# Patient Record
Sex: Female | Born: 1990 | Race: White | Hispanic: No | Marital: Married | Smoking: Never smoker
Health system: Southern US, Community
[De-identification: ages and names within clinical notes are randomized; demographics above are authoritative.]

## PROBLEM LIST (undated history)

## (undated) DIAGNOSIS — Z8482 Family history of sudden infant death syndrome: Secondary | ICD-10-CM

## (undated) DIAGNOSIS — Z8619 Personal history of other infectious and parasitic diseases: Secondary | ICD-10-CM

## (undated) HISTORY — PX: TONSILLECTOMY: SUR1361

## (undated) HISTORY — PX: WISDOM TOOTH EXTRACTION: SHX21

---
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
## 2010-04-30 LAB — CBC
HCT: 34.9 % — ABNORMAL LOW (ref 36.0–46.0)
Hemoglobin: 12 g/dL (ref 12.0–15.0)
Hemoglobin: 13.1 g/dL (ref 12.0–15.0)
MCH: 30.7 pg (ref 26.0–34.0)
MCHC: 34.3 g/dL (ref 30.0–36.0)
MCV: 89.4 fL (ref 78.0–100.0)
Platelets: 185 10*3/uL (ref 150–400)
Platelets: 199 10*3/uL (ref 150–400)
RBC: 3.9 MIL/uL (ref 3.87–5.11)
RBC: 4.27 MIL/uL (ref 3.87–5.11)
RDW: 14.2 % (ref 11.5–15.5)
RDW: 14.1 % (ref 11.5–15.5)
WBC: 17.1 10*3/uL — ABNORMAL HIGH (ref 4.0–10.5)

## 2010-04-30 LAB — DIFFERENTIAL
Basophils Relative: 0 % (ref 0–1)
Eosinophils Absolute: 0.3 10*3/uL (ref 0.0–0.7)
Lymphocytes Relative: 26 % (ref 12–46)
Monocytes Absolute: 0.7 10*3/uL (ref 0.1–1.0)
Neutrophils Relative %: 63 % (ref 43–77)

## 2010-04-30 LAB — WET PREP, GENITAL: Trich, Wet Prep: NONE SEEN

## 2010-10-29 IMAGING — US US OB DETAIL+14 WK
1 series · 14 of 28 positions shown · non-contrast
Comparison: none

OBSTETRICAL ULTRASOUND:
 This ultrasound exam was performed in the [HOSPITAL] Ultrasound Department.  The OB US report was generated in the AS system, and faxed to the ordering physician.  This report is also available in [HOSPITAL]?s AccessANYware and in [REDACTED] PACS.

[Series 1: us ob detail +14 wk · 0.27mm/px · 85 acquisitions, 14 frames shown]
[im 4/85]
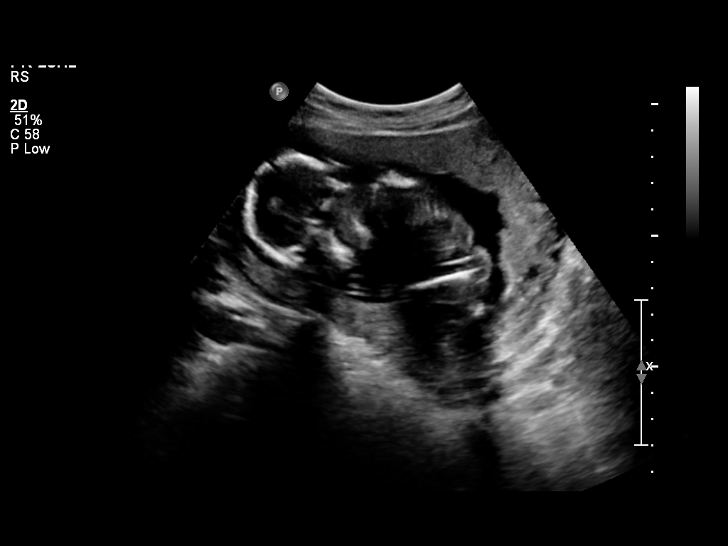
[im 10/85]
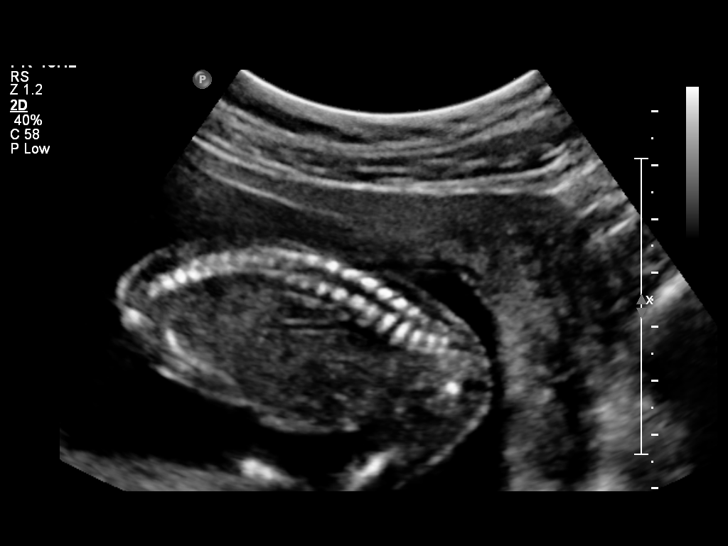
[im 16/85]
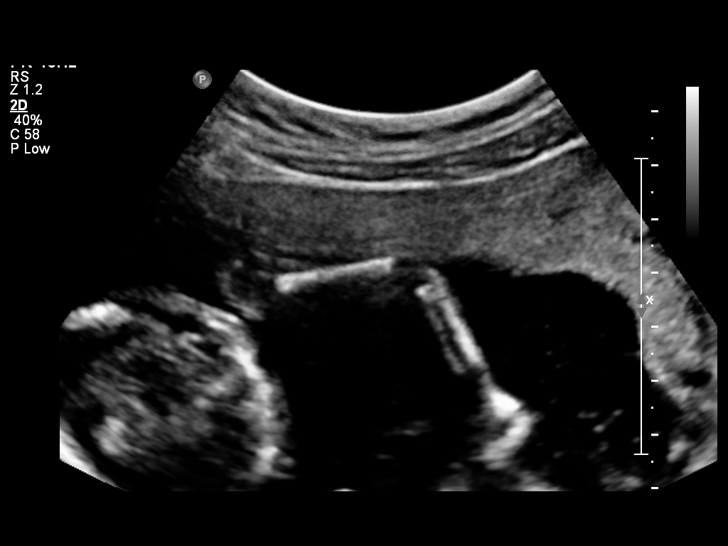
[im 22/85]
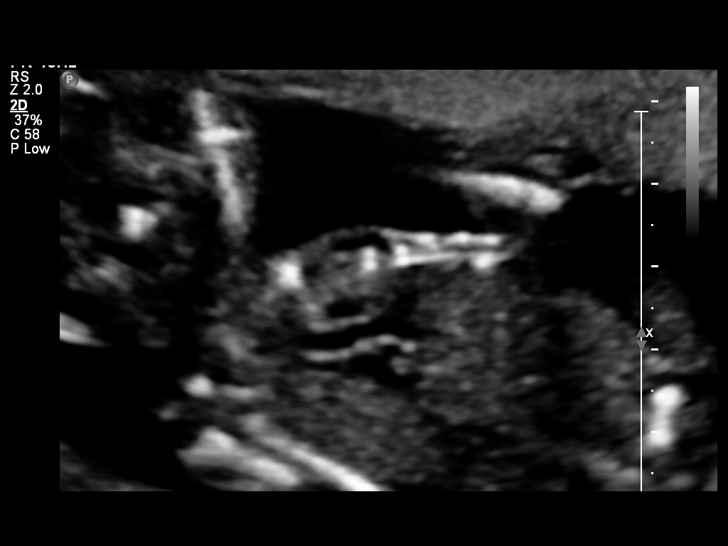
[im 29/85]
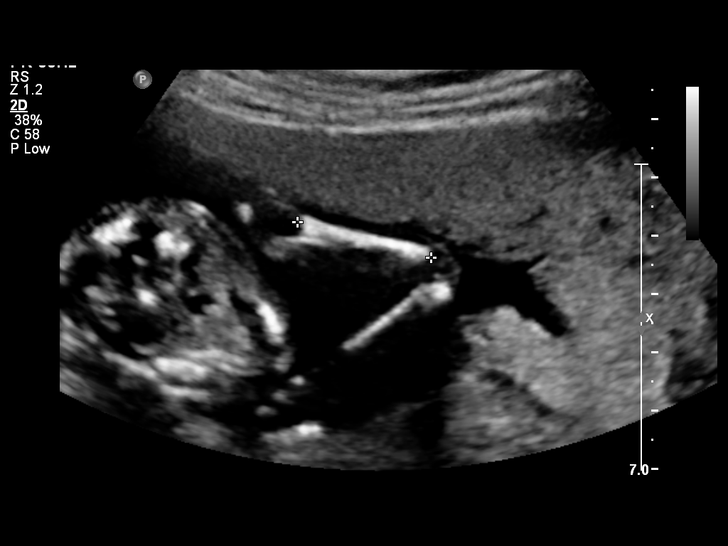
[im 35/85]
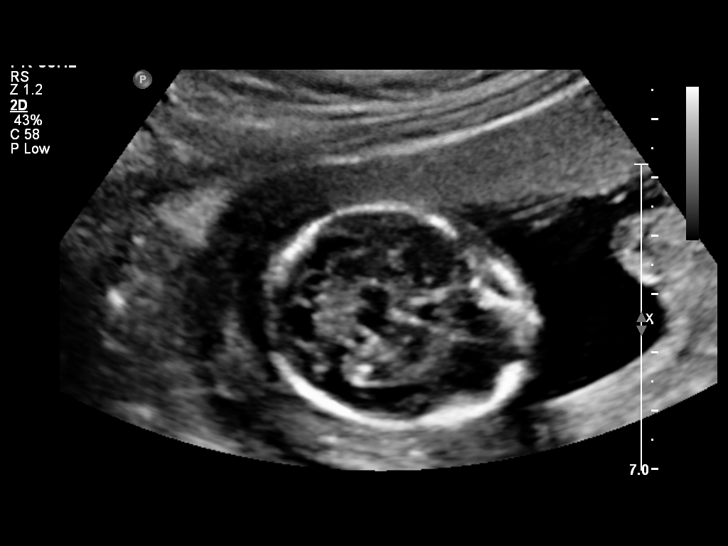
[im 41/85]
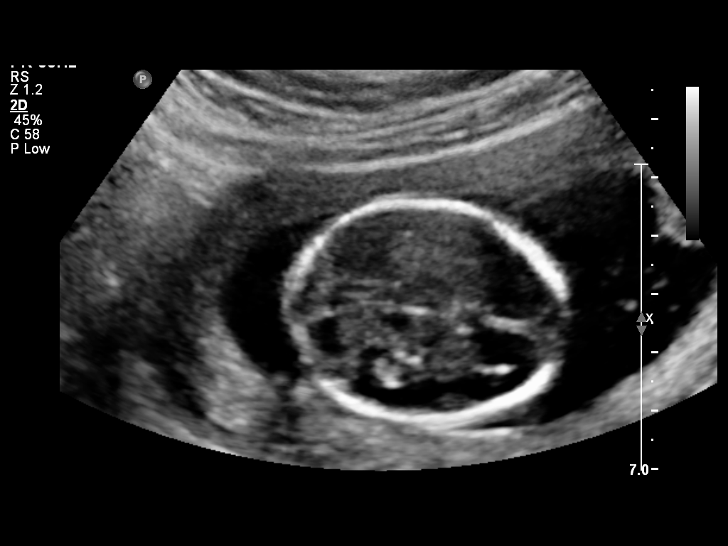
[im 47/85]
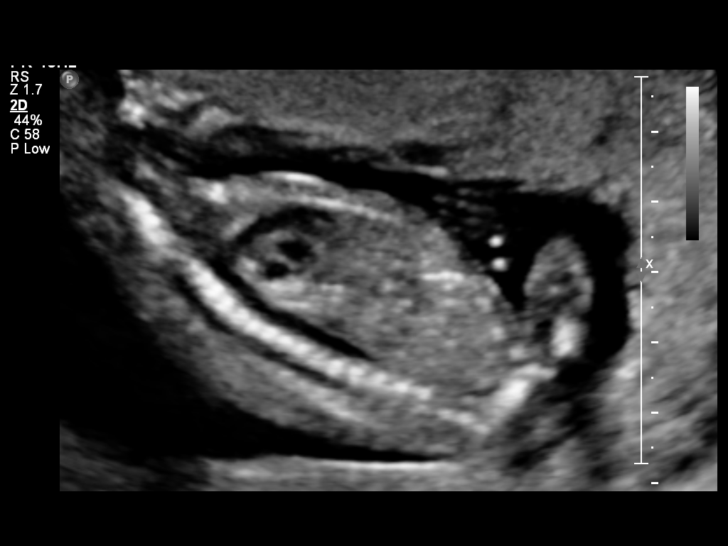
[im 53/85]
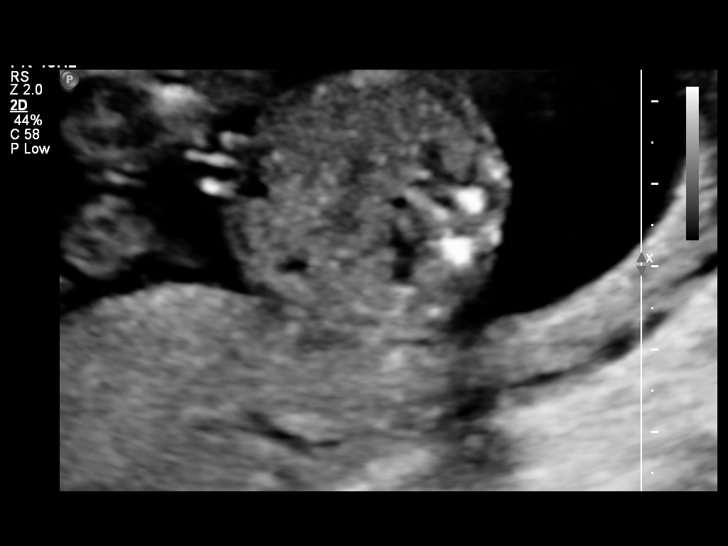
[im 60/85]
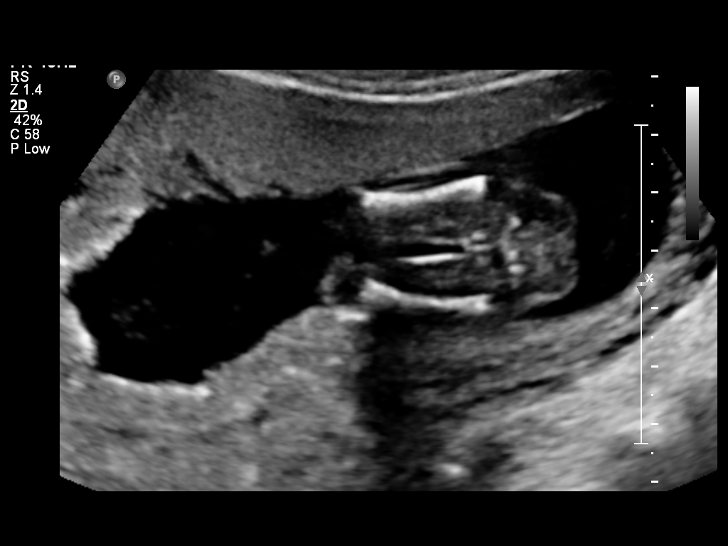
[im 66/85]
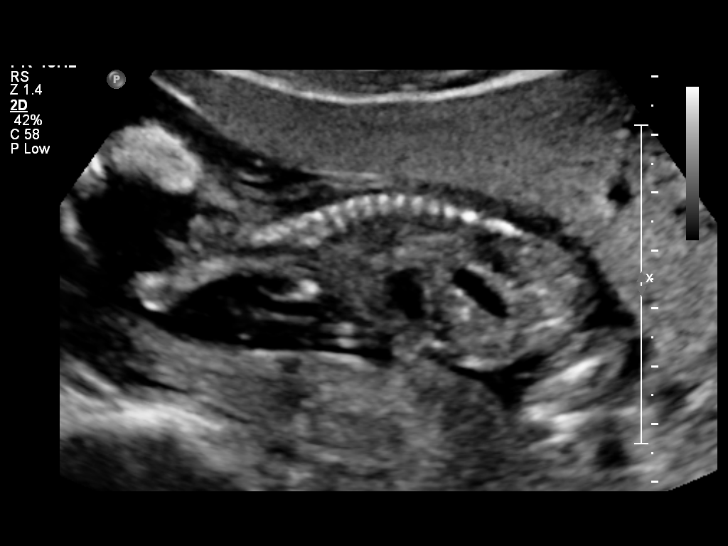
[im 72/85]
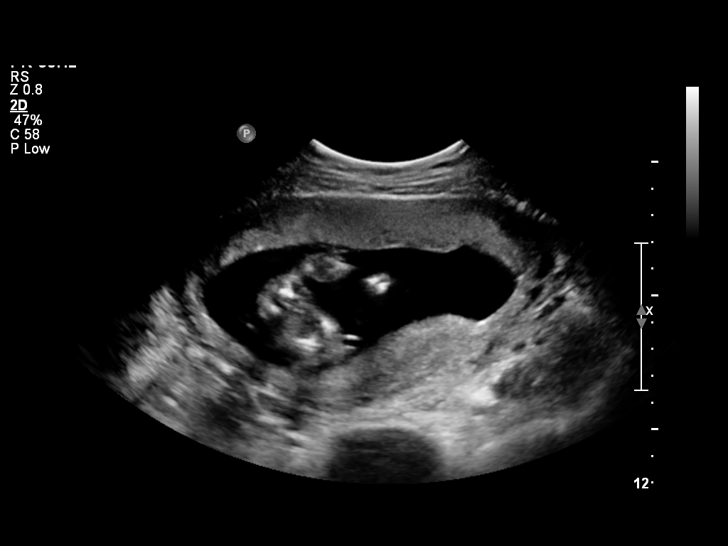
[im 78/85]
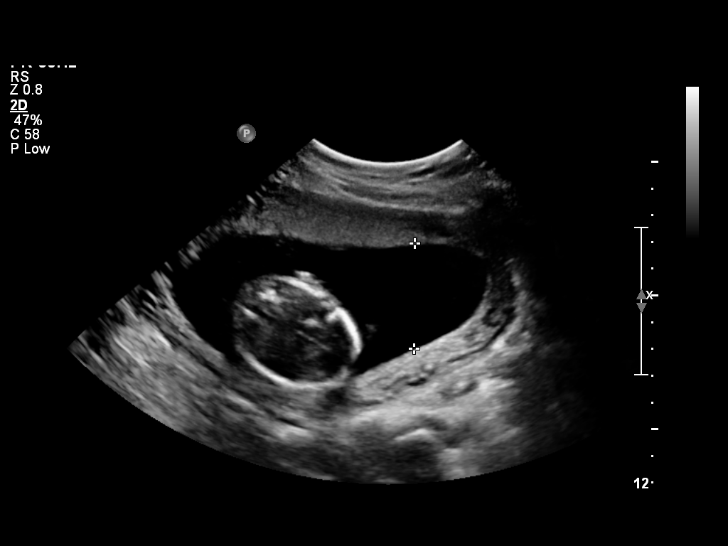
[im 85/85]
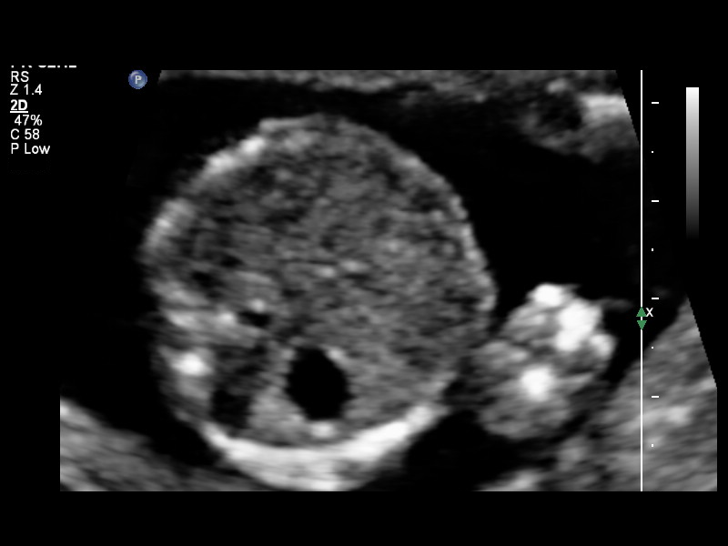

[14 of 28 positions shown; findings below may reference images not displayed]

IMPRESSION: See AS Obstetric US report.

## 2015-12-19 LAB — OB RESULTS CONSOLE GC/CHLAMYDIA
CHLAMYDIA, DNA PROBE: NEGATIVE
GC PROBE AMP, GENITAL: NEGATIVE

## 2015-12-19 LAB — OB RESULTS CONSOLE ABO/RH: RH Type: POSITIVE

## 2015-12-19 LAB — OB RESULTS CONSOLE RUBELLA ANTIBODY, IGM: RUBELLA: IMMUNE

## 2015-12-19 LAB — OB RESULTS CONSOLE HEPATITIS B SURFACE ANTIGEN: Hepatitis B Surface Ag: NEGATIVE

## 2015-12-19 LAB — OB RESULTS CONSOLE ANTIBODY SCREEN: ANTIBODY SCREEN: NEGATIVE

## 2015-12-19 LAB — OB RESULTS CONSOLE HIV ANTIBODY (ROUTINE TESTING): HIV: NONREACTIVE

## 2015-12-19 LAB — OB RESULTS CONSOLE RPR: RPR: NONREACTIVE

## 2016-05-04 DIAGNOSIS — N760 Acute vaginitis: Secondary | ICD-10-CM | POA: Diagnosis not present

## 2016-06-07 DIAGNOSIS — N760 Acute vaginitis: Secondary | ICD-10-CM | POA: Diagnosis not present

## 2016-07-19 ENCOUNTER — Encounter (HOSPITAL_COMMUNITY): Payer: Self-pay | Admitting: *Deleted

## 2016-07-19 ENCOUNTER — Telehealth (HOSPITAL_COMMUNITY): Payer: Self-pay | Admitting: *Deleted

## 2016-07-19 LAB — OB RESULTS CONSOLE GBS: GBS: NEGATIVE

## 2016-07-19 NOTE — Telephone Encounter (Signed)
Preadmission screen  

## 2016-07-25 ENCOUNTER — Inpatient Hospital Stay (HOSPITAL_COMMUNITY): Admission: AD | Admit: 2016-07-25 | Payer: 59 | Source: Ambulatory Visit | Admitting: Obstetrics and Gynecology

## 2016-07-28 ENCOUNTER — Encounter (HOSPITAL_COMMUNITY)
Admission: RE | Disposition: A | Discharge status: Home / Self Care | Payer: Self-pay | Source: Ambulatory Visit | Attending: Obstetrics & Gynecology

## 2016-07-28 ENCOUNTER — Inpatient Hospital Stay (HOSPITAL_COMMUNITY): Payer: 59 | Admitting: Anesthesiology

## 2016-07-28 ENCOUNTER — Inpatient Hospital Stay (HOSPITAL_COMMUNITY)
Admission: RE | Admit: 2016-07-28 | Discharge: 2016-07-31 | DRG: 765 | Disposition: A | Discharge status: Home / Self Care | Payer: 59 | Source: Ambulatory Visit | Attending: Obstetrics & Gynecology | Admitting: Obstetrics & Gynecology

## 2016-07-28 ENCOUNTER — Encounter (HOSPITAL_COMMUNITY): Payer: Self-pay

## 2016-07-28 ENCOUNTER — Telehealth (HOSPITAL_COMMUNITY): Payer: Self-pay | Admitting: Obstetrics and Gynecology

## 2016-07-28 DIAGNOSIS — A6004 Herpesviral vulvovaginitis: Secondary | ICD-10-CM | POA: Diagnosis present

## 2016-07-28 DIAGNOSIS — Z98891 History of uterine scar from previous surgery: Secondary | ICD-10-CM

## 2016-07-28 DIAGNOSIS — O480 Post-term pregnancy: Principal | ICD-10-CM | POA: Diagnosis present

## 2016-07-28 DIAGNOSIS — Z3490 Encounter for supervision of normal pregnancy, unspecified, unspecified trimester: Secondary | ICD-10-CM

## 2016-07-28 DIAGNOSIS — Z3A40 40 weeks gestation of pregnancy: Secondary | ICD-10-CM | POA: Diagnosis not present

## 2016-07-28 DIAGNOSIS — O9832 Other infections with a predominantly sexual mode of transmission complicating childbirth: Secondary | ICD-10-CM | POA: Diagnosis present

## 2016-07-28 LAB — TYPE AND SCREEN
ABO/RH(D): O POS
Antibody Screen: NEGATIVE

## 2016-07-28 LAB — CBC
HEMATOCRIT: 37.9 % (ref 36.0–46.0)
HEMOGLOBIN: 12.8 g/dL (ref 12.0–15.0)
MCH: 30 pg (ref 26.0–34.0)
MCHC: 33.8 g/dL (ref 30.0–36.0)
MCV: 89 fL (ref 78.0–100.0)
Platelets: 206 10*3/uL (ref 150–400)
RBC: 4.26 MIL/uL (ref 3.87–5.11)
RDW: 13.8 % (ref 11.5–15.5)
WBC: 13 10*3/uL — AB (ref 4.0–10.5)

## 2016-07-28 LAB — RPR: RPR: NONREACTIVE

## 2016-07-28 LAB — ABO/RH: ABO/RH(D): O POS

## 2016-07-28 SURGERY — Surgical Case
Anesthesia: Spinal

## 2016-07-28 MED ORDER — OXYCODONE-ACETAMINOPHEN 5-325 MG PO TABS: 2.0000 | ORAL_TABLET | ORAL | Status: DC | PRN

## 2016-07-28 MED ORDER — LACTATED RINGERS IV SOLN
INTRAVENOUS | Status: DC
  Administered 2016-07-28 (×5): via INTRAVENOUS

## 2016-07-28 MED ORDER — MENTHOL 3 MG MT LOZG: 1.0000 | LOZENGE | OROMUCOSAL | Status: DC | PRN

## 2016-07-28 MED ORDER — OXYTOCIN 40 UNITS IN LACTATED RINGERS INFUSION - SIMPLE MED
1.0000 m[IU]/min | INTRAVENOUS | Status: DC
  Administered 2016-07-28: 2 m[IU]/min via INTRAVENOUS
  Filled 2016-07-28: qty 1000

## 2016-07-28 MED ORDER — ZOLPIDEM TARTRATE 5 MG PO TABS: 5.0000 mg | ORAL_TABLET | Freq: Every evening | ORAL | Status: DC | PRN

## 2016-07-28 MED ORDER — ONDANSETRON HCL 4 MG/2ML IJ SOLN
INTRAMUSCULAR | Status: DC | PRN
  Administered 2016-07-28: 4 mg via INTRAVENOUS

## 2016-07-28 MED ORDER — SCOPOLAMINE 1 MG/3DAYS TD PT72
MEDICATED_PATCH | TRANSDERMAL | Status: AC
  Filled 2016-07-28: qty 1

## 2016-07-28 MED ORDER — TERBUTALINE SULFATE 1 MG/ML IJ SOLN: 0.2500 mg | Freq: Once | INTRAMUSCULAR | Status: DC | PRN

## 2016-07-28 MED ORDER — FENTANYL CITRATE (PF) 100 MCG/2ML IJ SOLN
INTRAMUSCULAR | Status: DC | PRN
  Administered 2016-07-28: 10 ug via INTRATHECAL

## 2016-07-28 MED ORDER — PANTOPRAZOLE SODIUM 40 MG PO TBEC: 40.0000 mg | DELAYED_RELEASE_TABLET | Freq: Every day | ORAL | Status: DC

## 2016-07-28 MED ORDER — FENTANYL CITRATE (PF) 100 MCG/2ML IJ SOLN: 25.0000 ug | INTRAMUSCULAR | Status: DC | PRN

## 2016-07-28 MED ORDER — MORPHINE SULFATE (PF) 0.5 MG/ML IJ SOLN
INTRAMUSCULAR | Status: DC | PRN
  Administered 2016-07-28: .2 mg via INTRATHECAL

## 2016-07-28 MED ORDER — OXYCODONE-ACETAMINOPHEN 5-325 MG PO TABS
1.0000 | ORAL_TABLET | ORAL | Status: DC | PRN
  Filled 2016-07-28: qty 1

## 2016-07-28 MED ORDER — LACTATED RINGERS IV SOLN
INTRAVENOUS | Status: DC
  Administered 2016-07-29: 04:00:00 via INTRAVENOUS

## 2016-07-28 MED ORDER — OXYTOCIN 10 UNIT/ML IJ SOLN
INTRAVENOUS | Status: DC | PRN
  Administered 2016-07-28: 40 [IU] via INTRAVENOUS

## 2016-07-28 MED ORDER — MISOPROSTOL 25 MCG QUARTER TABLET
25.0000 ug | ORAL_TABLET | ORAL | Status: DC | PRN
  Administered 2016-07-28: 25 ug via VAGINAL
  Filled 2016-07-28: qty 1

## 2016-07-28 MED ORDER — ERYTHROMYCIN 5 MG/GM OP OINT
TOPICAL_OINTMENT | OPHTHALMIC | Status: AC
  Filled 2016-07-28: qty 1

## 2016-07-28 MED ORDER — CEFAZOLIN SODIUM-DEXTROSE 2-3 GM-% IV SOLR
INTRAVENOUS | Status: DC | PRN
  Administered 2016-07-28: 2 g via INTRAVENOUS

## 2016-07-28 MED ORDER — KETOROLAC TROMETHAMINE 30 MG/ML IJ SOLN
30.0000 mg | Freq: Four times a day (QID) | INTRAMUSCULAR | Status: AC | PRN
  Administered 2016-07-28: 30 mg via INTRAMUSCULAR

## 2016-07-28 MED ORDER — FENTANYL CITRATE (PF) 100 MCG/2ML IJ SOLN: 50.0000 ug | INTRAMUSCULAR | Status: DC | PRN

## 2016-07-28 MED ORDER — LACTATED RINGERS IV SOLN
500.0000 mL | INTRAVENOUS | Status: DC | PRN
  Administered 2016-07-28 (×2): 500 mL via INTRAVENOUS

## 2016-07-28 MED ORDER — MORPHINE SULFATE (PF) 0.5 MG/ML IJ SOLN
INTRAMUSCULAR | Status: AC
  Filled 2016-07-28: qty 10

## 2016-07-28 MED ORDER — DIBUCAINE 1 % RE OINT: 1.0000 "application " | TOPICAL_OINTMENT | RECTAL | Status: DC | PRN

## 2016-07-28 MED ORDER — WITCH HAZEL-GLYCERIN EX PADS: 1.0000 "application " | MEDICATED_PAD | CUTANEOUS | Status: DC | PRN

## 2016-07-28 MED ORDER — DEXAMETHASONE SODIUM PHOSPHATE 4 MG/ML IJ SOLN
INTRAMUSCULAR | Status: DC | PRN
  Administered 2016-07-28: 4 mg via INTRAVENOUS

## 2016-07-28 MED ORDER — FLEET ENEMA 7-19 GM/118ML RE ENEM: 1.0000 | ENEMA | RECTAL | Status: DC | PRN

## 2016-07-28 MED ORDER — DOCUSATE SODIUM 100 MG PO CAPS
100.0000 mg | ORAL_CAPSULE | Freq: Two times a day (BID) | ORAL | Status: DC
  Administered 2016-07-29 – 2016-07-31 (×3): 100 mg via ORAL
  Filled 2016-07-28 (×5): qty 1

## 2016-07-28 MED ORDER — OXYTOCIN 40 UNITS IN LACTATED RINGERS INFUSION - SIMPLE MED
2.5000 [IU]/h | INTRAVENOUS | Status: DC
Start: 2016-07-28 — End: 2016-07-28

## 2016-07-28 MED ORDER — SOD CITRATE-CITRIC ACID 500-334 MG/5ML PO SOLN
30.0000 mL | ORAL | Status: DC | PRN
  Administered 2016-07-28: 30 mL via ORAL
  Filled 2016-07-28 (×2): qty 15

## 2016-07-28 MED ORDER — OXYTOCIN 10 UNIT/ML IJ SOLN
INTRAMUSCULAR | Status: AC
  Filled 2016-07-28: qty 4

## 2016-07-28 MED ORDER — OXYTOCIN BOLUS FROM INFUSION: 500.0000 mL | Freq: Once | INTRAVENOUS | Status: DC

## 2016-07-28 MED ORDER — KETOROLAC TROMETHAMINE 30 MG/ML IJ SOLN
INTRAMUSCULAR | Status: AC
  Filled 2016-07-28: qty 1

## 2016-07-28 MED ORDER — SENNOSIDES-DOCUSATE SODIUM 8.6-50 MG PO TABS
2.0000 | ORAL_TABLET | ORAL | Status: DC
  Administered 2016-07-28 – 2016-07-30 (×3): 2 via ORAL
  Filled 2016-07-28 (×3): qty 2

## 2016-07-28 MED ORDER — TETANUS-DIPHTH-ACELL PERTUSSIS 5-2.5-18.5 LF-MCG/0.5 IM SUSP: 0.5000 mL | Freq: Once | INTRAMUSCULAR | Status: DC

## 2016-07-28 MED ORDER — DIPHENHYDRAMINE HCL 25 MG PO CAPS
25.0000 mg | ORAL_CAPSULE | Freq: Four times a day (QID) | ORAL | Status: DC | PRN
  Administered 2016-07-29: 25 mg via ORAL
  Filled 2016-07-28: qty 1

## 2016-07-28 MED ORDER — SIMETHICONE 80 MG PO CHEW: 80.0000 mg | CHEWABLE_TABLET | ORAL | Status: DC | PRN

## 2016-07-28 MED ORDER — PHENYLEPHRINE 8 MG IN D5W 100 ML (0.08MG/ML) PREMIX OPTIME
INJECTION | INTRAVENOUS | Status: DC | PRN
  Administered 2016-07-28: 60 ug/min via INTRAVENOUS

## 2016-07-28 MED ORDER — LIDOCAINE HCL (PF) 1 % IJ SOLN: 30.0000 mL | INTRAMUSCULAR | Status: DC | PRN

## 2016-07-28 MED ORDER — COCONUT OIL OIL
1.0000 "application " | TOPICAL_OIL | Status: DC | PRN
  Filled 2016-07-28: qty 120

## 2016-07-28 MED ORDER — BUPIVACAINE IN DEXTROSE 0.75-8.25 % IT SOLN
INTRATHECAL | Status: DC | PRN
  Administered 2016-07-28: 10.5 mg via INTRATHECAL

## 2016-07-28 MED ORDER — ONDANSETRON HCL 4 MG/2ML IJ SOLN: 4.0000 mg | Freq: Four times a day (QID) | INTRAMUSCULAR | Status: DC | PRN

## 2016-07-28 MED ORDER — OXYTOCIN 40 UNITS IN LACTATED RINGERS INFUSION - SIMPLE MED: 2.5000 [IU]/h | INTRAVENOUS | Status: AC

## 2016-07-28 MED ORDER — METOCLOPRAMIDE HCL 5 MG/ML IJ SOLN: 10.0000 mg | Freq: Once | INTRAMUSCULAR | Status: DC | PRN

## 2016-07-28 MED ORDER — LACTATED RINGERS IV SOLN: INTRAVENOUS | Status: DC

## 2016-07-28 MED ORDER — SODIUM CHLORIDE 0.9 % IR SOLN
Status: DC | PRN
  Administered 2016-07-28: 1000 mL

## 2016-07-28 MED ORDER — DEXAMETHASONE SODIUM PHOSPHATE 4 MG/ML IJ SOLN
INTRAMUSCULAR | Status: AC
  Filled 2016-07-28: qty 1

## 2016-07-28 MED ORDER — OXYCODONE-ACETAMINOPHEN 5-325 MG PO TABS: 1.0000 | ORAL_TABLET | ORAL | Status: DC | PRN

## 2016-07-28 MED ORDER — CEFAZOLIN SODIUM-DEXTROSE 2-4 GM/100ML-% IV SOLN
INTRAVENOUS | Status: AC
  Filled 2016-07-28: qty 100

## 2016-07-28 MED ORDER — ACETAMINOPHEN 325 MG PO TABS: 650.0000 mg | ORAL_TABLET | Freq: Four times a day (QID) | ORAL | Status: DC | PRN

## 2016-07-28 MED ORDER — ACETAMINOPHEN 325 MG PO TABS
650.0000 mg | ORAL_TABLET | ORAL | Status: DC | PRN
  Administered 2016-07-29 – 2016-07-31 (×6): 650 mg via ORAL
  Filled 2016-07-28 (×6): qty 2

## 2016-07-28 MED ORDER — TERBUTALINE SULFATE 1 MG/ML IJ SOLN
0.2500 mg | Freq: Once | INTRAMUSCULAR | Status: DC | PRN
  Filled 2016-07-28: qty 1

## 2016-07-28 MED ORDER — KETOROLAC TROMETHAMINE 30 MG/ML IJ SOLN: 30.0000 mg | Freq: Four times a day (QID) | INTRAMUSCULAR | Status: AC | PRN

## 2016-07-28 MED ORDER — COMPLETENATE 29-1 MG PO CHEW: 1.0000 | CHEWABLE_TABLET | Freq: Every day | ORAL | Status: DC

## 2016-07-28 MED ORDER — ACETAMINOPHEN 325 MG PO TABS: 650.0000 mg | ORAL_TABLET | ORAL | Status: DC | PRN

## 2016-07-28 MED ORDER — IBUPROFEN 800 MG PO TABS: 800.0000 mg | ORAL_TABLET | Freq: Four times a day (QID) | ORAL | Status: DC | PRN

## 2016-07-28 MED ORDER — SIMETHICONE 80 MG PO CHEW
80.0000 mg | CHEWABLE_TABLET | Freq: Three times a day (TID) | ORAL | Status: DC
  Administered 2016-07-29 – 2016-07-31 (×4): 80 mg via ORAL
  Filled 2016-07-28 (×5): qty 1

## 2016-07-28 MED ORDER — IBUPROFEN 600 MG PO TABS
600.0000 mg | ORAL_TABLET | Freq: Four times a day (QID) | ORAL | Status: DC
  Administered 2016-07-29 – 2016-07-31 (×10): 600 mg via ORAL
  Filled 2016-07-28 (×11): qty 1

## 2016-07-28 MED ORDER — ONDANSETRON HCL 4 MG/2ML IJ SOLN
INTRAMUSCULAR | Status: AC
  Filled 2016-07-28: qty 2

## 2016-07-28 MED ORDER — PRENATAL MULTIVITAMIN CH
1.0000 | ORAL_TABLET | Freq: Every day | ORAL | Status: DC
  Administered 2016-07-29 – 2016-07-31 (×3): 1 via ORAL
  Filled 2016-07-28 (×3): qty 1

## 2016-07-28 MED ORDER — MEPERIDINE HCL 25 MG/ML IJ SOLN: 6.2500 mg | INTRAMUSCULAR | Status: DC | PRN

## 2016-07-28 MED ORDER — PHENYLEPHRINE 8 MG IN D5W 100 ML (0.08MG/ML) PREMIX OPTIME
INJECTION | INTRAVENOUS | Status: AC
  Filled 2016-07-28: qty 100

## 2016-07-28 MED ORDER — SCOPOLAMINE 1 MG/3DAYS TD PT72
MEDICATED_PATCH | TRANSDERMAL | Status: DC | PRN
  Administered 2016-07-28: 1 via TRANSDERMAL

## 2016-07-28 MED ORDER — FENTANYL CITRATE (PF) 100 MCG/2ML IJ SOLN
INTRAMUSCULAR | Status: AC
  Filled 2016-07-28: qty 2

## 2016-07-28 MED ORDER — CLINDAMYCIN PHOSPHATE 900 MG/50ML IV SOLN: 900.0000 mg | Freq: Once | INTRAVENOUS | Status: DC

## 2016-07-28 MED ORDER — SIMETHICONE 80 MG PO CHEW
80.0000 mg | CHEWABLE_TABLET | ORAL | Status: DC
  Administered 2016-07-28 – 2016-07-30 (×3): 80 mg via ORAL
  Filled 2016-07-28 (×3): qty 1

## 2016-07-28 SURGICAL SUPPLY — 32 items
BENZOIN TINCTURE PRP APPL 2/3 (GAUZE/BANDAGES/DRESSINGS) ×3 IMPLANT
CHLORAPREP W/TINT 26ML (MISCELLANEOUS) ×3 IMPLANT
CLAMP CORD UMBIL (MISCELLANEOUS) IMPLANT
CLOSURE WOUND 1/2 X4 (GAUZE/BANDAGES/DRESSINGS) ×1
CLOTH BEACON ORANGE TIMEOUT ST (SAFETY) ×3 IMPLANT
DERMABOND ADVANCED (GAUZE/BANDAGES/DRESSINGS)
DERMABOND ADVANCED .7 DNX12 (GAUZE/BANDAGES/DRESSINGS) IMPLANT
DRSG OPSITE POSTOP 4X10 (GAUZE/BANDAGES/DRESSINGS) ×3 IMPLANT
ELECT REM PT RETURN 9FT ADLT (ELECTROSURGICAL) ×3
ELECTRODE REM PT RTRN 9FT ADLT (ELECTROSURGICAL) ×1 IMPLANT
EXTRACTOR VACUUM KIWI (MISCELLANEOUS) IMPLANT
GLOVE BIO SURGEON STRL SZ 6 (GLOVE) ×6 IMPLANT
GLOVE BIOGEL PI IND STRL 6 (GLOVE) ×2 IMPLANT
GLOVE BIOGEL PI IND STRL 7.0 (GLOVE) ×1 IMPLANT
GLOVE BIOGEL PI INDICATOR 6 (GLOVE) ×4
GLOVE BIOGEL PI INDICATOR 7.0 (GLOVE) ×2
GOWN STRL REUS W/TWL LRG LVL3 (GOWN DISPOSABLE) ×6 IMPLANT
KIT ABG SYR 3ML LUER SLIP (SYRINGE) ×3 IMPLANT
NEEDLE HYPO 25X5/8 SAFETYGLIDE (NEEDLE) ×3 IMPLANT
NS IRRIG 1000ML POUR BTL (IV SOLUTION) ×3 IMPLANT
PACK C SECTION WH (CUSTOM PROCEDURE TRAY) ×3 IMPLANT
PAD OB MATERNITY 4.3X12.25 (PERSONAL CARE ITEMS) ×3 IMPLANT
PENCIL SMOKE EVAC W/HOLSTER (ELECTROSURGICAL) ×3 IMPLANT
STRIP CLOSURE SKIN 1/2X4 (GAUZE/BANDAGES/DRESSINGS) ×2 IMPLANT
SUT CHROMIC 0 CTX 36 (SUTURE) ×9 IMPLANT
SUT MON AB 2-0 CT1 27 (SUTURE) ×3 IMPLANT
SUT PDS AB 0 CT1 27 (SUTURE) IMPLANT
SUT PLAIN 0 NONE (SUTURE) IMPLANT
SUT VIC AB 0 CT1 36 (SUTURE) ×3 IMPLANT
SUT VIC AB 4-0 KS 27 (SUTURE) ×3 IMPLANT
TOWEL OR 17X24 6PK STRL BLUE (TOWEL DISPOSABLE) ×3 IMPLANT
TRAY FOLEY BAG SILVER LF 14FR (SET/KITS/TRAYS/PACK) IMPLANT

## 2016-07-28 NOTE — Op Note (Signed)
Annette Barry PROCEDURE DATE: 07/28/2016  PREOPERATIVE DIAGNOSIS: Intrauterine pregnancy at  [redacted]w[redacted]d weeks gestation, fetal intolerance of labor  POSTOPERATIVE DIAGNOSIS: The same  PROCEDURE:   Primary Low Transverse Cesarean Section  SURGEON:  Dr. Linda Hedges  INDICATIONS: Annette Barry is a 26 y.o. [phone] at [redacted]w[redacted]d scheduled for cesarean section secondary to fetal intolerance of labor.  The risks of cesarean section discussed with the patient included but were not limited to: bleeding which may require transfusion or reoperation; infection which may require antibiotics; injury to bowel, bladder, ureters or other surrounding organs; injury to the fetus; need for additional procedures including hysterectomy in the event of a life-threatening hemorrhage; placental abnormalities wth subsequent pregnancies, incisional problems, thromboembolic phenomenon and other postoperative/anesthesia complications. The patient concurred with the proposed plan, giving informed written consent for the procedure.    FINDINGS:  Viable female infant in cephalic presentation, APGARs 9,9: weight pending; clear amniotic fluid.  Intact placenta, three vessel cord.  Grossly normal uterus, ovaries and fallopian tubes. .   ANESTHESIA:  Spinal ESTIMATED BLOOD LOSS: 600 ml SPECIMENS: Placenta sent to pathology COMPLICATIONS: None immediate  PROCEDURE IN DETAIL:  The patient received intravenous antibiotics and had sequential compression devices applied to her lower extremities while in the preoperative area.  She was then taken to the operating room where spinal anesthesia was administered epidural anesthesia was dosed up to surgical level and was found to be adequate. She was then placed in a dorsal supine position with a leftward tilt, and prepped and draped in a sterile manner.  A foley catheter was placed into her bladder and attached to constant gravity.  After an adequate timeout was performed, a Pfannenstiel skin  incision was made with scalpel and carried through to the underlying layer of fascia. The fascia was incised in the midline and this incision was extended bilaterally using the Mayo scissors. Kocher clamps were applied to the superior aspect of the fascial incision and the underlying rectus muscles were dissected off bluntly. A similar process was carried out on the inferior aspect of the facial incision. The rectus muscles were separated in the midline bluntly and the peritoneum was entered bluntly.   Bladder flap was created sharply and developed bluntly.  Bladder blade was placed.  A transverse hysterotomy was made with a scalpel and extended bilaterally bluntly. The bladder blade was then removed. The infant was successfully delivered, and cord was clamped and cut and infant was handed over to awaiting neonatology team. Uterine massage was then administered and the placenta delivered intact with three-vessel cord. The uterus was cleared of clot and debris.  The hysterotomy was closed with 0 chromic.  A second imbricating suture of 0-chromic was used to reinforce the incision and aid in hemostasis.  The peritoneum and rectus muscles were noted to be hemostatic and were reapproximated using 2-0 monocryl in a running fashion.  The fascia was closed with 0-Vicryl in a running fashion with good restoration of anatomy.  The subcutaneus tissue was copiously irrigated.  The skin was closed with 4-0 vicryl in a subcuticular fashion.  Pt tolerated the procedure will.  All counts were correct x2.  Pt went to the recovery room in stable condition.

## 2016-07-28 NOTE — Progress Notes (Signed)
Patient had repetitive late decelerations despite multiple position changes and fluid resuscitation.  Pitocin was d/c'd and FHT immediately improved to 150 with moderate variability.  SVE 4/75/-1.  Patient counseled that I recommend primary C/S for fetal intolerance of labor.  She was informed of the risk of bleeding, infection, and scarring.  All questions were answered and the patient wishes to proceed.   Linda Hedges, DO

## 2016-07-28 NOTE — Progress Notes (Signed)
FHT 150-160s, + accels, occasional decel UCs q2-4 min Cx 3/80/-3

## 2016-07-28 NOTE — Progress Notes (Signed)
Dr. Lynnette Caffey at bedside, discussing with pt plan of care and recommendation of c-section for fetal heart intolerance. Discussing with pt risks and benefits of c-section. Pt verbalizes understanding and consents. FOB at bedside.

## 2016-07-28 NOTE — Anesthesia Procedure Notes (Signed)
Spinal  Patient location during procedure: OR Staffing Anesthesiologist: Taffy Delconte Performed: anesthesiologist  Preanesthetic Checklist Completed: patient identified, site marked, surgical consent, pre-op evaluation, timeout performed, IV checked, risks and benefits discussed and monitors and equipment checked Spinal Block Patient position: sitting Prep: DuraPrep Patient monitoring: heart rate, continuous pulse ox and blood pressure Approach: midline Location: L4-5 Injection technique: single-shot Needle Needle type: Sprotte  Needle gauge: 24 G Needle length: 9 cm Additional Notes Expiration date of kit checked and confirmed. Patient tolerated procedure well, without complications.       

## 2016-07-28 NOTE — Progress Notes (Signed)
Annette Barry is a 26 y.o. [phone] at [redacted]w[redacted]d by ultrasound admitted for induction of labor due to Post dates. Due date 07/25/16.  Subjective: Comfortable.  Feeling few mild CTX after one dose of VMP given around 130 am.    Objective: BP 120/70   Pulse 86   Temp 98.1 F (36.7 C) (Oral)   Resp 18   Ht 5\' 6"  (1.676 m)   Wt 178 lb (80.7 kg)   SpO2 100%   BMI 28.73 kg/m  No intake/output data recorded. No intake/output data recorded.  FHT:  FHR: 150 bpm, variability: moderate,  accelerations:  Present,  decelerations:  Absent UC:   irregular, every 5 minutes SVE:   Dilation: 3 Effacement (%): 80 Station: -3 Exam by:: Dr. Gaetano Net  Labs: Lab Results  Component Value Date   WBC 13.0 (H) 07/28/2016   HGB 12.8 07/28/2016   HCT 37.9 07/28/2016   MCV 89.0 07/28/2016   PLT 206 07/28/2016    Assessment / Plan: Induction of labor; will add pitocin  Labor: Progressing normally Preeclampsia:  n/a Fetal Wellbeing:  Category I Pain Control:  Labor support without medications I/D:  n/a Anticipated MOD:  NSVD  Annette Barry 07/28/2016, 7:45 AM

## 2016-07-28 NOTE — Anesthesia Preprocedure Evaluation (Signed)
Anesthesia Evaluation  Patient identified by MRN, date of birth, ID band Patient awake    Reviewed: Allergy & Precautions, NPO status , Patient's Chart, lab work & pertinent test results  Airway Mallampati: II  TM Distance: >3 FB Neck ROM: Full    Dental no notable dental hx.    Pulmonary neg pulmonary ROS,    Pulmonary exam normal breath sounds clear to auscultation       Cardiovascular negative cardio ROS Normal cardiovascular exam Rhythm:Regular Rate:Normal     Neuro/Psych negative neurological ROS  negative psych ROS   GI/Hepatic negative GI ROS, Neg liver ROS,   Endo/Other  negative endocrine ROS  Renal/GU negative Renal ROS  negative genitourinary   Musculoskeletal negative musculoskeletal ROS (+)   Abdominal   Peds negative pediatric ROS (+)  Hematology negative hematology ROS (+)   Anesthesia Other Findings   Reproductive/Obstetrics (+) Pregnancy                             Anesthesia Physical Anesthesia Plan  ASA: II and emergent  Anesthesia Plan: Spinal   Post-op Pain Management:    Induction:   PONV Risk Score and Plan: 1 and Ondansetron  Airway Management Planned: Natural Airway  Additional Equipment:   Intra-op Plan:   Post-operative Plan:   Informed Consent: I have reviewed the patients History and Physical, chart, labs and discussed the procedure including the risks, benefits and alternatives for the proposed anesthesia with the patient or authorized representative who has indicated his/her understanding and acceptance.   Dental advisory given  Plan Discussed with: CRNA  Anesthesia Plan Comments:         Anesthesia Quick Evaluation

## 2016-07-28 NOTE — Transfer of Care (Signed)
Immediate Anesthesia Transfer of Care Note  Patient: Annette Barry  Procedure(s) Performed: Procedure(s): CESAREAN SECTION (N/A)  Patient Location: PACU  Anesthesia Type:Spinal  Level of Consciousness: awake, alert  and oriented  Airway & Oxygen Therapy: Patient Spontanous Breathing  Post-op Assessment: Report given to RN and Post -op Vital signs reviewed and stable  Post vital signs: Reviewed and stable BP 102/56, SaO2 98%, HR 77, RR 12  Last Vitals:  Vitals:   07/28/16 1501 07/28/16 1601  BP: 113/66 (!) 110/55  Pulse: (!) 128 83  Resp:  18  Temp:  36.8 C    Last Pain:  Vitals:   07/28/16 1601  TempSrc: Oral  PainSc:          Complications: No apparent anesthesia complications

## 2016-07-28 NOTE — Anesthesia Pain Management Evaluation Note (Signed)
  CRNA Pain Management Visit Note  Patient: Annette Barry, 26 y.o., female  "Hello I am a member of the anesthesia team at [facility]. We have an anesthesia team available at all times to provide care throughout the hospital, including epidural management and anesthesia for C-section. I don't know your plan for the delivery whether it a natural birth, water birth, IV sedation, nitrous supplementation, doula or epidural, but we want to meet your pain goals."   1.Was your pain managed to your expectations on prior hospitalizations?   Yes   2.What is your expectation for pain management during this hospitalization?     Labor support without medications, Epidural, IV pain meds and Nitrous Oxide  3.How can we help you reach that goal? Pt open to discussion about all methods of pain management.  Record the patient's initial score and the patient's pain goal.   Pain: 2  Pain Goal: 10 [facility] wants you to be able to say your pain was always managed very well.  Eyan Hagood 07/28/2016

## 2016-07-28 NOTE — H&P (Signed)
Annette Barry is a 26 y.o. female presenting for post dates IIOL. Cytotec x 1 @ 1:30am. SROM with cervical check moments ago. 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 No lesions or symptoms now noted by patient. OB History    Gravida Para Term Preterm AB Living   4 2 2   1 2    SAB TAB Ectopic Multiple Live Births   1       2     History reviewed. No pertinent past medical history. Past Surgical History:  Procedure Laterality Date  . TONSILLECTOMY     Family History: family history includes Depression in her mother; Diabetes in her maternal grandmother; Heart disease in her father; Hypertension in her father. Social History:  reports that she has never smoked. She has never used smokeless tobacco. She reports that she does not drink alcohol or use drugs.     Maternal Diabetes: No Genetic Screening: Normal Maternal Ultrasounds/Referrals: Normal Fetal Ultrasounds or other Referrals:  None Maternal Substance Abuse:  No Significant Maternal Medications:  None Significant Maternal Lab Results:  None Other Comments:  None  Review of Systems  Eyes: Negative for blurred vision.  Gastrointestinal: Negative for abdominal pain.  Neurological: Negative for headaches.   Maternal Medical History:  Fetal activity: Perceived fetal activity is normal.      Dilation: 2 Effacement (%): 60 Station: Ballotable Exam by:: Dr. Gaetano Net Temperature 98.5 F (36.9 C), temperature source Oral, height 5\' 6"  (1.676 m), weight 178 lb (80.7 kg), SpO2 100 %. Maternal Exam:  Abdomen: Fetal presentation: vertex     Physical Exam  Cardiovascular: Normal rate and regular rhythm.   Respiratory: Effort normal and breath sounds normal.  GI: Soft. There is no tenderness.  Neurological: She has normal reflexes.    Cervix -/60/vtx/-3 SROM with check, clear  FHT late decels noted after Cytotec. Patient given IV fluid bolus 500cc x 2, oxygen, position change including  knee/chest. Late decels resolved. Right now baseline 160s with some 5 BPM decels  UCs q2-3 min, mod/firm   Prenatal labs: ABO, Rh: --/--/O POS (06/13 0115) Antibody: NEG (06/13 0115) Rubella: Immune (11/03 0000) RPR: Nonreactive (11/03 0000)  HBsAg: Negative (11/03 0000)  HIV: Non-reactive (11/03 0000)  GBS: Negative (06/04 0000)   Assessment/Plan: 26 yo G4P2 @ 40 3/7 weeks SROM Watch FHT closely   Annette Barry,Annette Barry E 07/28/2016, 3:45 AM

## 2016-07-29 LAB — CBC
HEMATOCRIT: 32.8 % — AB (ref 36.0–46.0)
Hemoglobin: 11.2 g/dL — ABNORMAL LOW (ref 12.0–15.0)
MCH: 30.6 pg (ref 26.0–34.0)
MCHC: 34.1 g/dL (ref 30.0–36.0)
MCV: 89.6 fL (ref 78.0–100.0)
PLATELETS: 191 10*3/uL (ref 150–400)
RBC: 3.66 MIL/uL — ABNORMAL LOW (ref 3.87–5.11)
RDW: 13.9 % (ref 11.5–15.5)
WBC: 21.8 10*3/uL — ABNORMAL HIGH (ref 4.0–10.5)

## 2016-07-29 MED ORDER — NALOXONE HCL 0.4 MG/ML IJ SOLN: 0.4000 mg | INTRAMUSCULAR | Status: DC | PRN

## 2016-07-29 MED ORDER — NALBUPHINE HCL 10 MG/ML IJ SOLN: 5.0000 mg | Freq: Once | INTRAMUSCULAR | Status: DC | PRN

## 2016-07-29 MED ORDER — ONDANSETRON HCL 4 MG/2ML IJ SOLN: 4.0000 mg | Freq: Three times a day (TID) | INTRAMUSCULAR | Status: DC | PRN

## 2016-07-29 MED ORDER — SODIUM CHLORIDE 0.9% FLUSH: 3.0000 mL | INTRAVENOUS | Status: DC | PRN

## 2016-07-29 MED ORDER — DIPHENHYDRAMINE HCL 50 MG/ML IJ SOLN: 12.5000 mg | INTRAMUSCULAR | Status: DC | PRN

## 2016-07-29 MED ORDER — SCOPOLAMINE 1 MG/3DAYS TD PT72: 1.0000 | MEDICATED_PATCH | Freq: Once | TRANSDERMAL | Status: DC

## 2016-07-29 MED ORDER — NALBUPHINE HCL 10 MG/ML IJ SOLN: 5.0000 mg | INTRAMUSCULAR | Status: DC | PRN

## 2016-07-29 MED ORDER — DEXTROSE 5 % IV SOLN
1.0000 ug/kg/h | INTRAVENOUS | Status: DC | PRN
  Filled 2016-07-29: qty 2

## 2016-07-29 MED ORDER — DIPHENHYDRAMINE HCL 25 MG PO CAPS: 25.0000 mg | ORAL_CAPSULE | ORAL | Status: DC | PRN

## 2016-07-29 NOTE — Clinical Social Work Maternal (Signed)
  CLINICAL SOCIAL WORK MATERNAL/CHILD NOTE  Patient Details  Name: Annette Barry MRN: [id number] Date of Birth: 24-Jan-1991  Date:  07/29/2016  Clinical Social Worker Initiating Note:  Annette Barry, MSW, LCSW-A  Date/ Time Initiated:  07/29/16/1126     Child's Name:  Annette Barry   Legal Guardian:  Other (Comment) (Not established by court system; MOB and FOB Annette Barry) parent collectively)   Need for Interpreter:  None   Date of Referral:  07/28/16     Reason for Referral:  Other (Comment) (MOB hx of anxiety and depression)   Referral Source:  RN   Address:  [place] [address]  Phone number:  [phone]   Household Members:  Self, Spouse   Natural Supports (not living in the home):  Parent, Extended Family, Friends, Immediate Family   Professional Supports: None   Employment: Full-time   Type of Work: Unknown    Education:  Economist:  Multimedia programmer   Other Resources:  Other (Comment) (None reported)   Cultural/Religious Considerations Which May Impact Care:  None reported.  Strengths:  Ability to meet basic needs , Compliance with medical plan , Home prepared for child    Risk Factors/Current Problems:  Mental Health Concerns    Cognitive State:  Alert , Able to Concentrate , Insightful    Mood/Affect:  Comfortable , Calm , Interested    CSW Assessment: CSW met with MOB at bedside to complete assessment for consult regarding hx of anxiety/depression. Upon this writers arrival, MOB was accompanied by FOB at which time she noted it was ok to continue. This Probation officer explained role and reasoning for visit. MOB was warm and inviting. This Probation officer inquired about hx of anxiety and depression. MOB notes she has dealt with it for some time. She notes the initial cause being the death of her baby at 4 months to SIDS. CSW observed MOB and FOB affect change slighty when MOB disclosed this information.  This Probation officer gave condolences and informed MOB and FOB that we did not have to discuss it further. Both MOB and FOB noted it was ok and they have since healed a lot since that time. This Probation officer inquired if MOB wanted resources for outpatient follow-up. MOB noted she is not interested at this time. CSW offered "feelings after birth" form and activities and groups at [facility] hospital education center. MOB was very thankful for that and noted she would be interested in maybe attending one of the classes. CSW assessed MOB's current mental state. MOB notes she feels great overall other than feeling tired from the delivery. This Probation officer informed MOB feeling tired is normal and to be expected. MOB verbalized understanding.  This Probation officer educated MOB on PPD and safe-sleeping/SIDS. MOB verbalized understanding. This Probation officer inquired if MOB was interested in medication to proactively treat anxiety/depression. MOB noted not at this time. CSW thanked MOB and FOB for participating in the assessment. At this time, CSW has no barriers to d/c.   CSW Plan/Description:  Information/Referral to [employer] , [employer] , No Further Intervention Required/No Barriers to Discharge    [employer], MSW, [facility]  Office: [phone]

## 2016-07-29 NOTE — Anesthesia Postprocedure Evaluation (Signed)
Anesthesia Post Note  Patient: Annette Barry  Procedure(s) Performed: Procedure(s) (LRB): CESAREAN SECTION (N/A)     Patient location during evaluation: Mother Baby Anesthesia Type: Spinal Level of consciousness: awake and alert Vital Signs Assessment: post-procedure vital signs reviewed and stable Respiratory status: spontaneous breathing Cardiovascular status: blood pressure returned to baseline Postop Assessment: no headache, patient able to bend at knees, no backache, no signs of nausea or vomiting, adequate PO intake and spinal receding Anesthetic complications: no    Last Vitals:  Vitals:   07/28/16 2345 07/29/16 0430  BP: (!) 109/55   Pulse: 77   Resp: 18 18  Temp: 37.3 C 36.7 C    Last Pain:  Vitals:   07/29/16 0511  TempSrc:   PainSc: 2    Pain Goal:                 Annette Barry

## 2016-07-29 NOTE — Progress Notes (Signed)
Subjective: Postpartum Day 1: Cesarean Delivery Patient reports tolerating PO.    Objective: Vital signs in last 24 hours: Temp:  [97.3 F (36.3 C)-99.1 F (37.3 C)] 98 F (36.7 C) (06/14 0430) Pulse Rate:  [64-133] 77 (06/13 2345) Resp:  [11-20] 18 (06/14 0430) BP: (102-128)/(52-76) 109/55 (06/13 2345) SpO2:  [95 %-100 %] 96 % (06/14 0430)  Physical Exam:  General: alert and cooperative Lochia: appropriate Uterine Fundus: firm, non- tender Incision: healing well DVT Evaluation: No evidence of DVT seen on physical exam. Negative Homan's sign. No cords or calf tenderness. No significant calf/ankle edema.   Recent Labs  07/28/16 0115 07/29/16 0513  HGB 12.8 11.2*  HCT 37.9 32.8*    Assessment/Plan: Status post Cesarean section. Doing well postoperatively.   Leukocytosis noted Plan to recheck CBC in am.  Annette Barry G 07/29/2016, 8:03 AM

## 2016-07-29 NOTE — Lactation Note (Signed)
This note was copied from a baby's chart. Lactation Consultation Note  Patient Name: Annette Barry [name] [id number] Date: 07/29/2016 Reason for consult: Initial assessment Mom c/o of nipple tenderness. Baby nursing frequently but Mom reports difficulty obtaining good depth. Basic teaching reviewed with Mom, positioning discussed. Encouraged to continue to BF with feeding ques, 8-12 times or more in 24 hours. Lactation brochure left for review. Mom to call with next feeding for [facility] assist. Encouraged to apply EBM to tendern nipples.   Maternal Data    Feeding    LATCH Score/Interventions                      Lactation Tools Discussed/Used WIC Program: No   Consult Status Consult Status: Follow-up Date: 07/29/16 Follow-up type: In-patient    Katrine Coho 07/29/2016, 4:15 PM

## 2016-07-30 LAB — CBC
HEMATOCRIT: 30.3 % — AB (ref 36.0–46.0)
Hemoglobin: 10.2 g/dL — ABNORMAL LOW (ref 12.0–15.0)
MCH: 30.4 pg (ref 26.0–34.0)
MCHC: 33.7 g/dL (ref 30.0–36.0)
MCV: 90.4 fL (ref 78.0–100.0)
Platelets: 176 10*3/uL (ref 150–400)
RBC: 3.35 MIL/uL — ABNORMAL LOW (ref 3.87–5.11)
RDW: 14.3 % (ref 11.5–15.5)
WBC: 12.5 10*3/uL — AB (ref 4.0–10.5)

## 2016-07-30 MED ORDER — OXYCODONE-ACETAMINOPHEN 5-325 MG PO TABS: 1.0000 | ORAL_TABLET | ORAL | 0 refills | Status: DC | PRN

## 2016-07-30 MED ORDER — IBUPROFEN 800 MG PO TABS: 800.0000 mg | ORAL_TABLET | Freq: Four times a day (QID) | ORAL | 0 refills | Status: DC | PRN

## 2016-07-30 NOTE — Discharge Summary (Signed)
Obstetric Discharge Summary Reason for Admission: induction of labor Prenatal Procedures: none Intrapartum Procedures: cesarean: low cervical, transverse Postpartum Procedures: none Complications-Operative and Postpartum: none Hemoglobin  Date Value Ref Range Status  07/30/2016 10.2 (L) 12.0 - 15.0 g/dL Final   HCT  Date Value Ref Range Status  07/30/2016 30.3 (L) 36.0 - 46.0 % Final    Physical Exam:  General: alert Lochia: appropriate Uterine Fundus: firm Incision: healing well DVT Evaluation: No evidence of DVT seen on physical exam.  Discharge Diagnoses: Term Pregnancy-delivered, fetal intolerance t labor, LTCS  Discharge Information: Date: 07/30/2016 Activity: pelvic rest Diet: routine Medications: PNV, Ibuprofen and Percocet Condition: stable Instructions: refer to practice specific booklet Discharge to: home Follow-up Information    [place] up in 10 day(s).   Contact information: [place] [address] [phone]           Newborn Data: Live born female  Birth Weight: 7 lb 4.1 oz (3290 g) APGAR: 9, 9  Home with mother.  Annette Barry 07/30/2016, 7:51 AM

## 2016-07-30 NOTE — Lactation Note (Signed)
This note was copied from a baby's chart. Lactation Consultation Note: Mother complaints of sore nipples. She has bilateral positional strips. Mother was given comfort gels. Advised mother to use cross cradle hold or football hold when latching. Advised to use off centered latch. Encouraged mother to do good support of infants neck and shoulders. Observed infant latched on the left breast. Infant suckling on and off with short burst of suckling and a few swallows. Mother taught to do breast compression. Mother was advised to do good breast massage and ice breast when milk is coming in. Mother was given a harmony hand pump and advised to use as needed. Mother encouraged to cue base feed and feed infant 8-12 times in 24 hours. Mother informed of rental pump if needed. She reports that she has ordered her pump but it has not come in yet. Mother thinks she will be ok using her hand pump. Mother is aware of available [facility] services .   Patient Name: Annette Barry [id number] Date: 07/30/2016     Maternal Data    Feeding Feeding Type: Breast Fed  LATCH Score/Interventions Latch: Grasps breast easily, tongue down, lips flanged, rhythmical sucking. Intervention(s): Adjust position;Breast compression;Assist with latch;Breast massage  Audible Swallowing: A few with stimulation Intervention(s): Hand expression;Alternate breast massage  Type of Nipple: Everted at rest and after stimulation  Comfort (Breast/Nipple): Filling, red/small blisters or bruises, mild/mod discomfort  Problem noted: Mild/Moderate discomfort Interventions (Mild/moderate discomfort): Comfort gels  Hold (Positioning): No assistance needed to correctly position infant at breast. Intervention(s): Breastfeeding basics reviewed;Support Pillows;Position options  LATCH Score: 8  Lactation Tools Discussed/Used     Consult Status      Darla Lesches 07/30/2016, 10:34 AM

## 2016-07-31 NOTE — Discharge Summary (Signed)
  Obstetric Discharge Summary Reason for Admission: induction of labor Prenatal Procedures: none Intrapartum Procedures: cesarean: low cervical, transverse Postpartum Procedures: none Complications-Operative and Postpartum: none Last Labs       Hemoglobin  Date Value Ref Range Status  07/30/2016 10.2 (L) 12.0 - 15.0 g/dL Final        HCT  Date Value Ref Range Status  07/30/2016 30.3 (L) 36.0 - 46.0 % Final      Physical Exam:  General: alert Lochia: appropriate Uterine Fundus: firm Incision: healing well DVT Evaluation: No evidence of DVT seen on physical exam.  Discharge Diagnoses: Term Pregnancy-delivered, fetal intolerance t labor, LTCS  Discharge Information: Date: 07/30/2016 Activity: pelvic rest Diet: routine Medications: PNV, Ibuprofen and Percocet Condition: stable Instructions: refer to practice specific booklet Discharge to: home    Follow-up Information    [place] Follow up in 10 day(s).   Contact information: [place] [address] [phone]           Newborn Data: Live born female  Birth Weight: 7 lb 4.1 oz (3290 g) APGAR: 9, 9  Home with mother.  Annette Barry 07/30/2016, 7:51 AM

## 2016-07-31 NOTE — Lactation Note (Signed)
This note was copied from a baby's chart. Lactation Consultation Note  Patient Name: Annette Barry [id number] Date: 07/31/2016 Reason for consult: Follow-up assessment;Breast/nipple pain  Mom states nipples are very sore.  Both nipples cracked at the base.  Mom would like to try a nipple shield.  Breasts are full this AM. Instructed on application of a 24 mm nipple shield.  After a few attempts baby latched well and mom much more comfortable.  Good swallows noted.  Discussed how to wean from shield once nipples healed.  Reviewed outpatient lactation services and support and encouraged to call prn.   Maternal Data    Feeding Feeding Type: Breast Fed Length of feed: 30 min  LATCH Score/Interventions Latch: Grasps breast easily, tongue down, lips flanged, rhythmical sucking. Intervention(s): Breast compression;Breast massage;Assist with latch;Adjust position  Audible Swallowing: Spontaneous and intermittent Intervention(s): Hand expression;Alternate breast massage  Type of Nipple: Everted at rest and after stimulation  Comfort (Breast/Nipple): Filling, red/small blisters or bruises, mild/mod discomfort  Problem noted: Cracked, bleeding, blisters, bruises;Mild/Moderate discomfort Interventions (Mild/moderate discomfort): Comfort gels  Hold (Positioning): Assistance needed to correctly position infant at breast and maintain latch. Intervention(s): Breastfeeding basics reviewed;Support Pillows  LATCH Score: 8  Lactation Tools Discussed/Used Tools: Nipple Shields Nipple shield size: 24   Consult Status Consult Status: Complete    Abir Craine S 07/31/2016, 9:05 AM

## 2017-05-12 DIAGNOSIS — D229 Melanocytic nevi, unspecified: Secondary | ICD-10-CM | POA: Diagnosis not present

## 2017-05-12 DIAGNOSIS — Z0000 Encounter for general adult medical examination without abnormal findings: Secondary | ICD-10-CM | POA: Diagnosis not present

## 2017-05-12 DIAGNOSIS — Z01419 Encounter for gynecological examination (general) (routine) without abnormal findings: Secondary | ICD-10-CM | POA: Diagnosis not present

## 2017-05-26 DIAGNOSIS — Z6827 Body mass index (BMI) 27.0-27.9, adult: Secondary | ICD-10-CM | POA: Diagnosis not present

## 2017-05-26 DIAGNOSIS — R609 Edema, unspecified: Secondary | ICD-10-CM | POA: Diagnosis not present

## 2017-05-26 DIAGNOSIS — I8393 Asymptomatic varicose veins of bilateral lower extremities: Secondary | ICD-10-CM | POA: Diagnosis not present

## 2017-05-27 DIAGNOSIS — D225 Melanocytic nevi of trunk: Secondary | ICD-10-CM | POA: Diagnosis not present

## 2017-05-27 DIAGNOSIS — D485 Neoplasm of uncertain behavior of skin: Secondary | ICD-10-CM | POA: Diagnosis not present

## 2017-05-27 DIAGNOSIS — L578 Other skin changes due to chronic exposure to nonionizing radiation: Secondary | ICD-10-CM | POA: Diagnosis not present

## 2017-11-15 DIAGNOSIS — Z01419 Encounter for gynecological examination (general) (routine) without abnormal findings: Secondary | ICD-10-CM | POA: Diagnosis not present

## 2017-11-15 DIAGNOSIS — Z6826 Body mass index (BMI) 26.0-26.9, adult: Secondary | ICD-10-CM | POA: Diagnosis not present

## 2017-11-22 DIAGNOSIS — Z1329 Encounter for screening for other suspected endocrine disorder: Secondary | ICD-10-CM | POA: Diagnosis not present

## 2017-11-22 DIAGNOSIS — Z1321 Encounter for screening for nutritional disorder: Secondary | ICD-10-CM | POA: Diagnosis not present

## 2017-11-22 DIAGNOSIS — Z131 Encounter for screening for diabetes mellitus: Secondary | ICD-10-CM | POA: Diagnosis not present

## 2017-11-22 DIAGNOSIS — Z13220 Encounter for screening for lipoid disorders: Secondary | ICD-10-CM | POA: Diagnosis not present

## 2018-01-03 DIAGNOSIS — N7689 Other specified inflammation of vagina and vulva: Secondary | ICD-10-CM | POA: Diagnosis not present

## 2018-01-03 DIAGNOSIS — N760 Acute vaginitis: Secondary | ICD-10-CM | POA: Diagnosis not present

## 2018-02-14 DIAGNOSIS — Z3169 Encounter for other general counseling and advice on procreation: Secondary | ICD-10-CM | POA: Diagnosis not present

## 2018-02-14 DIAGNOSIS — Z3200 Encounter for pregnancy test, result unknown: Secondary | ICD-10-CM | POA: Diagnosis not present

## 2018-03-14 DIAGNOSIS — N911 Secondary amenorrhea: Secondary | ICD-10-CM | POA: Diagnosis not present

## 2018-04-12 DIAGNOSIS — Z3481 Encounter for supervision of other normal pregnancy, first trimester: Secondary | ICD-10-CM | POA: Diagnosis not present

## 2018-04-12 DIAGNOSIS — Z3685 Encounter for antenatal screening for Streptococcus B: Secondary | ICD-10-CM | POA: Diagnosis not present

## 2018-04-12 LAB — OB RESULTS CONSOLE ABO/RH: RH Type: POSITIVE

## 2018-04-12 LAB — OB RESULTS CONSOLE HIV ANTIBODY (ROUTINE TESTING): HIV: NONREACTIVE

## 2018-04-12 LAB — OB RESULTS CONSOLE GC/CHLAMYDIA
Chlamydia: NEGATIVE
Gonorrhea: NEGATIVE

## 2018-04-12 LAB — OB RESULTS CONSOLE ANTIBODY SCREEN: Antibody Screen: NEGATIVE

## 2018-04-12 LAB — OB RESULTS CONSOLE RUBELLA ANTIBODY, IGM: Rubella: IMMUNE

## 2018-04-12 LAB — OB RESULTS CONSOLE RPR: RPR: NONREACTIVE

## 2018-04-12 LAB — OB RESULTS CONSOLE HEPATITIS B SURFACE ANTIGEN: Hepatitis B Surface Ag: NEGATIVE

## 2018-04-17 DIAGNOSIS — Z3400 Encounter for supervision of normal first pregnancy, unspecified trimester: Secondary | ICD-10-CM | POA: Diagnosis not present

## 2018-05-08 DIAGNOSIS — J029 Acute pharyngitis, unspecified: Secondary | ICD-10-CM | POA: Diagnosis not present

## 2018-05-08 DIAGNOSIS — Z7189 Other specified counseling: Secondary | ICD-10-CM | POA: Diagnosis not present

## 2018-05-08 DIAGNOSIS — J309 Allergic rhinitis, unspecified: Secondary | ICD-10-CM | POA: Diagnosis not present

## 2018-05-18 DIAGNOSIS — Z3A18 18 weeks gestation of pregnancy: Secondary | ICD-10-CM | POA: Diagnosis not present

## 2018-05-23 DIAGNOSIS — J0100 Acute maxillary sinusitis, unspecified: Secondary | ICD-10-CM | POA: Diagnosis not present

## 2018-09-22 LAB — OB RESULTS CONSOLE GBS: GBS: NEGATIVE

## 2018-10-04 ENCOUNTER — Encounter (HOSPITAL_COMMUNITY): Payer: Self-pay | Admitting: *Deleted

## 2018-10-04 NOTE — Patient Instructions (Signed)
Annette Barry  10/04/2018   Your procedure is scheduled on:  10/19/2018  Arrive at [place] at Entrance C on [address] at [facility]  and [employer]. You are invited to use the FREE valet parking or use the Visitor's parking deck.  Pick up the phone at the desk and dial [phone].  Call this number if you have problems the morning of surgery: [phone]  Remember:   Do not eat food:(After Midnight) Desps de medianoche.  Do not drink clear liquids: (After Midnight) Desps de medianoche.  Take these medicines the morning of surgery with A SIP OF WATER:  none   Do not wear jewelry, make-up or nail polish.  Do not wear lotions, powders, or perfumes. Do not wear deodorant.  Do not shave 48 hours prior to surgery.  Do not bring valuables to the hospital.  [facility] is not   responsible for any belongings or valuables brought to the hospital.  Contacts, dentures or bridgework may not be worn into surgery.  Leave suitcase in the car. After surgery it may be brought to your room.  For patients admitted to the hospital, checkout time is 11:00 AM the day of              discharge.      Please read over the following fact sheets that you were given:     Preparing for Surgery

## 2018-10-11 NOTE — H&P (Signed)
Annette Barry is a 28 y.o. female presenting for repeat C/S; previous x 1.  Patient has had uncomplicated antepartum course.  Patient has h/o first child SIDS at 28 mo.  GBS negative.  OB History    Gravida  5   Para  3   Term  3   Preterm      AB  1   Living  3     SAB  1   TAB      Ectopic      Multiple  0   Live Births  3          Past Medical History:  Diagnosis Date  . Family history of SIDS (sudden infant death syndrome)    first child died at 71 mos  . Hx of chlamydia infection    Past Surgical History:  Procedure Laterality Date  . CESAREAN SECTION N/A 07/28/2016   Procedure: CESAREAN SECTION;  Surgeon: Linda Hedges, DO;  Location: [place];  Service: Obstetrics;  Laterality: N/A;  . TONSILLECTOMY     Family History: family history includes Depression in her mother; Diabetes in her maternal grandmother; Heart disease in her father; Hypertension in her father. Social History:  reports that she has never smoked. She has never used smokeless tobacco. She reports that she does not drink alcohol or use drugs.     Maternal Diabetes: No Genetic Screening: Normal Maternal Ultrasounds/Referrals: Normal Fetal Ultrasounds or other Referrals:  None Maternal Substance Abuse:  No Significant Maternal Medications:  None Significant Maternal Lab Results:  Group B Strep negative Other Comments:  None  Review of Systems  HENT: Negative for ear pain.    Maternal Medical History:  Prenatal complications: no prenatal complications Prenatal Complications - Diabetes: none.      Height 5\' 5"  (1.651 m), weight 79.8 kg, last menstrual period 01/11/2018, unknown if currently breastfeeding. Maternal Exam:  Abdomen: Patient reports no abdominal tenderness. Surgical scars: low transverse.   Fundal height is c/w dates.   Estimated fetal weight is 7#8.       Physical Exam  Constitutional: She is oriented to person, place, and time. She appears  well-developed and well-nourished.  GI: Soft. There is no rebound and no guarding.  Neurological: She is alert and oriented to person, place, and time.  Skin: Skin is warm and dry.  Psychiatric: She has a normal mood and affect. Her behavior is normal.    Prenatal labs: ABO, Rh: O/Positive/-- (02/26 0000) Antibody: Negative (02/26 0000) Rubella: Immune (02/26 0000) RPR: Nonreactive (02/26 0000)  HBsAg: Negative (02/26 0000)  HIV: Non-reactive (02/26 0000)  GBS: Negative (08/07 0000)   Assessment/Plan: 28yo [id number] at 10 weeks for repeat C/S -Patient has been counseled re: risk of bleeding, infection, scarring, and damage to surrounding structures.  She understands this will commit her to repeat C/S in future and increased risk of uterine rupture and abnormal placentation.  All questions were answered.    Linda Hedges 10/11/2018, 7:18 AM

## 2018-10-17 ENCOUNTER — Other Ambulatory Visit: Payer: Self-pay

## 2018-10-17 ENCOUNTER — Other Ambulatory Visit (HOSPITAL_COMMUNITY)
Admission: RE | Admit: 2018-10-17 | Discharge: 2018-10-17 | Disposition: A | Discharge status: Home / Self Care | Payer: 59 | Source: Ambulatory Visit | Attending: Obstetrics & Gynecology | Admitting: Obstetrics & Gynecology

## 2018-10-17 LAB — CBC
HCT: 35.1 % — ABNORMAL LOW (ref 36.0–46.0)
Hemoglobin: 10.9 g/dL — ABNORMAL LOW (ref 12.0–15.0)
MCH: 27 pg (ref 26.0–34.0)
MCHC: 31.1 g/dL (ref 30.0–36.0)
MCV: 87.1 fL (ref 80.0–100.0)
Platelets: 191 10*3/uL (ref 150–400)
RBC: 4.03 MIL/uL (ref 3.87–5.11)
RDW: 13.4 % (ref 11.5–15.5)
WBC: 9.1 10*3/uL (ref 4.0–10.5)
nRBC: 0 % (ref 0.0–0.2)

## 2018-10-17 LAB — TYPE AND SCREEN
ABO/RH(D): O POS
Antibody Screen: NEGATIVE

## 2018-10-17 LAB — ABO/RH: ABO/RH(D): O POS

## 2018-10-17 LAB — SARS CORONAVIRUS 2 BY RT PCR (HOSPITAL ORDER, PERFORMED IN ~~LOC~~ HOSPITAL LAB): SARS Coronavirus 2: NEGATIVE

## 2018-10-17 NOTE — MAU Note (Signed)
Covid swab collected. Pt tolerated well. PT asymptomatic 

## 2018-10-18 LAB — RPR: RPR Ser Ql: NONREACTIVE

## 2018-10-18 MED ORDER — GENTAMICIN SULFATE 40 MG/ML IJ SOLN
5.0000 mg/kg | INTRAVENOUS | Status: AC
  Administered 2018-10-19: 07:00:00 330.4 mg via INTRAVENOUS
  Filled 2018-10-18: qty 8.25

## 2018-10-18 MED ORDER — CLINDAMYCIN PHOSPHATE 900 MG/50ML IV SOLN
900.0000 mg | INTRAVENOUS | Status: AC
  Administered 2018-10-19: 08:00:00 900 mg via INTRAVENOUS

## 2018-10-19 ENCOUNTER — Encounter (HOSPITAL_COMMUNITY): Payer: Self-pay | Admitting: *Deleted

## 2018-10-19 ENCOUNTER — Other Ambulatory Visit: Payer: Self-pay

## 2018-10-19 ENCOUNTER — Encounter (HOSPITAL_COMMUNITY)
Admission: RE | Disposition: A | Discharge status: Home / Self Care | Payer: Self-pay | Source: Home / Self Care | Attending: Obstetrics & Gynecology

## 2018-10-19 ENCOUNTER — Inpatient Hospital Stay (HOSPITAL_COMMUNITY): Payer: 59 | Admitting: Anesthesiology

## 2018-10-19 ENCOUNTER — Inpatient Hospital Stay (HOSPITAL_COMMUNITY)
Admission: RE | Admit: 2018-10-19 | Discharge: 2018-10-21 | DRG: 785 | Disposition: A | Discharge status: Home / Self Care | Payer: 59 | Attending: Obstetrics & Gynecology | Admitting: Obstetrics & Gynecology

## 2018-10-19 DIAGNOSIS — Z3A39 39 weeks gestation of pregnancy: Secondary | ICD-10-CM | POA: Diagnosis not present

## 2018-10-19 DIAGNOSIS — Z302 Encounter for sterilization: Secondary | ICD-10-CM

## 2018-10-19 DIAGNOSIS — O34211 Maternal care for low transverse scar from previous cesarean delivery: Principal | ICD-10-CM | POA: Diagnosis present

## 2018-10-19 DIAGNOSIS — Z98891 History of uterine scar from previous surgery: Secondary | ICD-10-CM

## 2018-10-19 HISTORY — DX: Personal history of other infectious and parasitic diseases: Z86.19

## 2018-10-19 HISTORY — DX: Family history of sudden infant death syndrome: Z84.82

## 2018-10-19 SURGERY — Surgical Case
Anesthesia: Spinal

## 2018-10-19 MED ORDER — COCONUT OIL OIL: 1.0000 "application " | TOPICAL_OIL | Status: DC | PRN

## 2018-10-19 MED ORDER — PHENYLEPHRINE HCL-NACL 20-0.9 MG/250ML-% IV SOLN
INTRAVENOUS | Status: DC | PRN
  Administered 2018-10-19: 60 ug/min via INTRAVENOUS

## 2018-10-19 MED ORDER — SIMETHICONE 80 MG PO CHEW: 80.0000 mg | CHEWABLE_TABLET | ORAL | Status: DC | PRN

## 2018-10-19 MED ORDER — ACETAMINOPHEN 325 MG PO TABS: 650.0000 mg | ORAL_TABLET | ORAL | Status: DC | PRN

## 2018-10-19 MED ORDER — OXYTOCIN 40 UNITS IN NORMAL SALINE INFUSION - SIMPLE MED
INTRAVENOUS | Status: DC | PRN
  Administered 2018-10-19: 200 mL via INTRAVENOUS

## 2018-10-19 MED ORDER — NALBUPHINE HCL 10 MG/ML IJ SOLN: 5.0000 mg | INTRAMUSCULAR | Status: DC | PRN

## 2018-10-19 MED ORDER — HYDROMORPHONE HCL 1 MG/ML IJ SOLN
1.0000 mg | Freq: Once | INTRAMUSCULAR | Status: AC
  Administered 2018-10-19: 1 mg via INTRAVENOUS
  Filled 2018-10-19: qty 1

## 2018-10-19 MED ORDER — DIPHENHYDRAMINE HCL 50 MG/ML IJ SOLN: 12.5000 mg | INTRAMUSCULAR | Status: DC | PRN

## 2018-10-19 MED ORDER — SCOPOLAMINE 1 MG/3DAYS TD PT72
MEDICATED_PATCH | TRANSDERMAL | Status: AC
  Filled 2018-10-19: qty 1

## 2018-10-19 MED ORDER — MORPHINE SULFATE (PF) 0.5 MG/ML IJ SOLN
INTRAMUSCULAR | Status: DC | PRN
  Administered 2018-10-19: 150 ug via INTRATHECAL

## 2018-10-19 MED ORDER — ONDANSETRON HCL 4 MG/2ML IJ SOLN: 4.0000 mg | Freq: Three times a day (TID) | INTRAMUSCULAR | Status: DC | PRN

## 2018-10-19 MED ORDER — SODIUM CHLORIDE 0.9% FLUSH: 3.0000 mL | INTRAVENOUS | Status: DC | PRN

## 2018-10-19 MED ORDER — LACTATED RINGERS IV SOLN
INTRAVENOUS | Status: DC
  Administered 2018-10-19: 07:00:00 via INTRAVENOUS

## 2018-10-19 MED ORDER — PHENYLEPHRINE HCL-NACL 20-0.9 MG/250ML-% IV SOLN
INTRAVENOUS | Status: AC
  Filled 2018-10-19: qty 500

## 2018-10-19 MED ORDER — FENTANYL CITRATE (PF) 100 MCG/2ML IJ SOLN
INTRAMUSCULAR | Status: DC | PRN
  Administered 2018-10-19: 15 ug via EPIDURAL

## 2018-10-19 MED ORDER — FENTANYL CITRATE (PF) 100 MCG/2ML IJ SOLN
INTRAMUSCULAR | Status: DC | PRN
  Administered 2018-10-19: 100 ug via INTRAVENOUS

## 2018-10-19 MED ORDER — NALOXONE HCL 0.4 MG/ML IJ SOLN: 0.4000 mg | INTRAMUSCULAR | Status: DC | PRN

## 2018-10-19 MED ORDER — DEXAMETHASONE SODIUM PHOSPHATE 4 MG/ML IJ SOLN
INTRAMUSCULAR | Status: DC | PRN
  Administered 2018-10-19: 4 mg via INTRAVENOUS

## 2018-10-19 MED ORDER — BUPIVACAINE HCL (PF) 0.75 % IJ SOLN
INTRAMUSCULAR | Status: DC | PRN
  Administered 2018-10-19: 1.6 mL

## 2018-10-19 MED ORDER — SODIUM CHLORIDE 0.9 % IR SOLN
Status: DC | PRN
  Administered 2018-10-19: 1000 mL

## 2018-10-19 MED ORDER — SOD CITRATE-CITRIC ACID 500-334 MG/5ML PO SOLN
ORAL | Status: AC
  Filled 2018-10-19: qty 30

## 2018-10-19 MED ORDER — DIPHENHYDRAMINE HCL 25 MG PO CAPS: 25.0000 mg | ORAL_CAPSULE | Freq: Four times a day (QID) | ORAL | Status: DC | PRN

## 2018-10-19 MED ORDER — SENNOSIDES-DOCUSATE SODIUM 8.6-50 MG PO TABS
2.0000 | ORAL_TABLET | ORAL | Status: DC
  Administered 2018-10-20 – 2018-10-21 (×2): 2 via ORAL
  Filled 2018-10-19 (×2): qty 2

## 2018-10-19 MED ORDER — STERILE WATER FOR IRRIGATION IR SOLN
Status: DC | PRN
  Administered 2018-10-19: 1000 mL

## 2018-10-19 MED ORDER — KETOROLAC TROMETHAMINE 30 MG/ML IJ SOLN
30.0000 mg | Freq: Once | INTRAMUSCULAR | Status: AC | PRN
  Administered 2018-10-19: 30 mg via INTRAVENOUS

## 2018-10-19 MED ORDER — OXYCODONE-ACETAMINOPHEN 5-325 MG PO TABS: 1.0000 | ORAL_TABLET | ORAL | Status: DC | PRN

## 2018-10-19 MED ORDER — MEPERIDINE HCL 25 MG/ML IJ SOLN: 6.2500 mg | INTRAMUSCULAR | Status: DC | PRN

## 2018-10-19 MED ORDER — DEXAMETHASONE SODIUM PHOSPHATE 4 MG/ML IJ SOLN
INTRAMUSCULAR | Status: AC
  Filled 2018-10-19: qty 1

## 2018-10-19 MED ORDER — PROMETHAZINE HCL 25 MG/ML IJ SOLN: 6.2500 mg | INTRAMUSCULAR | Status: DC | PRN

## 2018-10-19 MED ORDER — KETOROLAC TROMETHAMINE 30 MG/ML IJ SOLN
INTRAMUSCULAR | Status: AC
  Filled 2018-10-19: qty 1

## 2018-10-19 MED ORDER — NALBUPHINE HCL 10 MG/ML IJ SOLN: 5.0000 mg | Freq: Once | INTRAMUSCULAR | Status: DC | PRN

## 2018-10-19 MED ORDER — FENTANYL CITRATE (PF) 100 MCG/2ML IJ SOLN
INTRAMUSCULAR | Status: AC
  Filled 2018-10-19: qty 2

## 2018-10-19 MED ORDER — LACTATED RINGERS IV SOLN
INTRAVENOUS | Status: DC
  Administered 2018-10-19 (×2): via INTRAVENOUS

## 2018-10-19 MED ORDER — OXYTOCIN 40 UNITS IN NORMAL SALINE INFUSION - SIMPLE MED: 2.5000 [IU]/h | INTRAVENOUS | Status: AC

## 2018-10-19 MED ORDER — MENTHOL 3 MG MT LOZG: 1.0000 | LOZENGE | OROMUCOSAL | Status: DC | PRN

## 2018-10-19 MED ORDER — CLINDAMYCIN PHOSPHATE 900 MG/50ML IV SOLN
INTRAVENOUS | Status: AC
  Filled 2018-10-19: qty 50

## 2018-10-19 MED ORDER — HYDROMORPHONE HCL 1 MG/ML IJ SOLN: 0.2500 mg | INTRAMUSCULAR | Status: DC | PRN

## 2018-10-19 MED ORDER — OXYCODONE HCL 5 MG/5ML PO SOLN: 5.0000 mg | Freq: Once | ORAL | Status: DC | PRN

## 2018-10-19 MED ORDER — SIMETHICONE 80 MG PO CHEW
80.0000 mg | CHEWABLE_TABLET | Freq: Three times a day (TID) | ORAL | Status: DC
  Administered 2018-10-20 (×2): 80 mg via ORAL
  Filled 2018-10-19 (×3): qty 1

## 2018-10-19 MED ORDER — OXYTOCIN 40 UNITS IN NORMAL SALINE INFUSION - SIMPLE MED
INTRAVENOUS | Status: AC
  Filled 2018-10-19: qty 1000

## 2018-10-19 MED ORDER — OXYCODONE HCL 5 MG PO TABS: 5.0000 mg | ORAL_TABLET | Freq: Once | ORAL | Status: DC | PRN

## 2018-10-19 MED ORDER — PRENATAL MULTIVITAMIN CH
1.0000 | ORAL_TABLET | Freq: Every day | ORAL | Status: DC
  Administered 2018-10-20: 1 via ORAL
  Filled 2018-10-19: qty 1

## 2018-10-19 MED ORDER — MORPHINE SULFATE (PF) 0.5 MG/ML IJ SOLN
INTRAMUSCULAR | Status: AC
  Filled 2018-10-19: qty 10

## 2018-10-19 MED ORDER — CHLOROPROCAINE HCL (PF) 3 % IJ SOLN
INTRAMUSCULAR | Status: DC | PRN
  Administered 2018-10-19: 20 mL

## 2018-10-19 MED ORDER — ZOLPIDEM TARTRATE 5 MG PO TABS: 5.0000 mg | ORAL_TABLET | Freq: Every evening | ORAL | Status: DC | PRN

## 2018-10-19 MED ORDER — ONDANSETRON HCL 4 MG/2ML IJ SOLN
INTRAMUSCULAR | Status: AC
  Filled 2018-10-19: qty 2

## 2018-10-19 MED ORDER — DIBUCAINE (PERIANAL) 1 % EX OINT: 1.0000 "application " | TOPICAL_OINTMENT | CUTANEOUS | Status: DC | PRN

## 2018-10-19 MED ORDER — TETANUS-DIPHTH-ACELL PERTUSSIS 5-2.5-18.5 LF-MCG/0.5 IM SUSP: 0.5000 mL | Freq: Once | INTRAMUSCULAR | Status: DC

## 2018-10-19 MED ORDER — CHLOROPROCAINE HCL (PF) 3 % IJ SOLN
INTRAMUSCULAR | Status: AC
  Filled 2018-10-19: qty 20

## 2018-10-19 MED ORDER — WITCH HAZEL-GLYCERIN EX PADS: 1.0000 "application " | MEDICATED_PAD | CUTANEOUS | Status: DC | PRN

## 2018-10-19 MED ORDER — SCOPOLAMINE 1 MG/3DAYS TD PT72
1.0000 | MEDICATED_PATCH | Freq: Once | TRANSDERMAL | Status: DC
  Administered 2018-10-19: 1.5 mg via TRANSDERMAL

## 2018-10-19 MED ORDER — SOD CITRATE-CITRIC ACID 500-334 MG/5ML PO SOLN
30.0000 mL | Freq: Once | ORAL | Status: AC
  Administered 2018-10-19: 30 mL via ORAL

## 2018-10-19 MED ORDER — SIMETHICONE 80 MG PO CHEW
80.0000 mg | CHEWABLE_TABLET | ORAL | Status: DC
  Administered 2018-10-20 – 2018-10-21 (×2): 80 mg via ORAL
  Filled 2018-10-19 (×2): qty 1

## 2018-10-19 MED ORDER — ONDANSETRON HCL 4 MG/2ML IJ SOLN
INTRAMUSCULAR | Status: DC | PRN
  Administered 2018-10-19: 4 mg via INTRAVENOUS

## 2018-10-19 MED ORDER — NALOXONE HCL 4 MG/10ML IJ SOLN
1.0000 ug/kg/h | INTRAVENOUS | Status: DC | PRN
  Filled 2018-10-19: qty 5

## 2018-10-19 MED ORDER — IBUPROFEN 800 MG PO TABS
800.0000 mg | ORAL_TABLET | Freq: Four times a day (QID) | ORAL | Status: DC | PRN
  Administered 2018-10-19 – 2018-10-21 (×6): 800 mg via ORAL
  Filled 2018-10-19 (×7): qty 1

## 2018-10-19 MED ORDER — DIPHENHYDRAMINE HCL 25 MG PO CAPS: 25.0000 mg | ORAL_CAPSULE | ORAL | Status: DC | PRN

## 2018-10-19 SURGICAL SUPPLY — 38 items
BENZOIN TINCTURE PRP APPL 2/3 (GAUZE/BANDAGES/DRESSINGS) ×4 IMPLANT
CHLORAPREP W/TINT 26ML (MISCELLANEOUS) ×4 IMPLANT
CLAMP CORD UMBIL (MISCELLANEOUS) IMPLANT
CLOSURE STERI STRIP 1/2 X4 (GAUZE/BANDAGES/DRESSINGS) ×4 IMPLANT
CLOSURE WOUND 1/2 X4 (GAUZE/BANDAGES/DRESSINGS)
CLOTH BEACON ORANGE TIMEOUT ST (SAFETY) ×4 IMPLANT
DERMABOND ADVANCED (GAUZE/BANDAGES/DRESSINGS)
DERMABOND ADVANCED .7 DNX12 (GAUZE/BANDAGES/DRESSINGS) IMPLANT
DRAPE C SECTION CLR SCREEN (DRAPES) ×4 IMPLANT
DRSG OPSITE POSTOP 4X10 (GAUZE/BANDAGES/DRESSINGS) ×4 IMPLANT
DRSG PAD ABDOMINAL 8X10 ST (GAUZE/BANDAGES/DRESSINGS) ×4 IMPLANT
ELECT REM PT RETURN 9FT ADLT (ELECTROSURGICAL) ×4
ELECTRODE REM PT RTRN 9FT ADLT (ELECTROSURGICAL) ×2 IMPLANT
EXTRACTOR VACUUM KIWI (MISCELLANEOUS) IMPLANT
GAUZE SPONGE 4X4 12PLY STRL LF (GAUZE/BANDAGES/DRESSINGS) ×8 IMPLANT
GLOVE BIO SURGEON STRL SZ 6 (GLOVE) ×4 IMPLANT
GLOVE BIOGEL PI IND STRL 6 (GLOVE) ×4 IMPLANT
GLOVE BIOGEL PI IND STRL 7.0 (GLOVE) ×2 IMPLANT
GLOVE BIOGEL PI INDICATOR 6 (GLOVE) ×4
GLOVE BIOGEL PI INDICATOR 7.0 (GLOVE) ×2
GOWN STRL REUS W/TWL LRG LVL3 (GOWN DISPOSABLE) ×8 IMPLANT
KIT ABG SYR 3ML LUER SLIP (SYRINGE) ×4 IMPLANT
NEEDLE HYPO 25X5/8 SAFETYGLIDE (NEEDLE) ×4 IMPLANT
NS IRRIG 1000ML POUR BTL (IV SOLUTION) ×4 IMPLANT
PACK C SECTION WH (CUSTOM PROCEDURE TRAY) ×4 IMPLANT
PAD OB MATERNITY 4.3X12.25 (PERSONAL CARE ITEMS) ×4 IMPLANT
PENCIL SMOKE EVAC W/HOLSTER (ELECTROSURGICAL) ×4 IMPLANT
STRIP CLOSURE SKIN 1/2X4 (GAUZE/BANDAGES/DRESSINGS) IMPLANT
SUT CHROMIC 0 CTX 36 (SUTURE) ×12 IMPLANT
SUT MON AB 2-0 CT1 27 (SUTURE) ×4 IMPLANT
SUT PDS AB 0 CT1 27 (SUTURE) IMPLANT
SUT PLAIN 0 NONE (SUTURE) IMPLANT
SUT VIC AB 0 CT1 36 (SUTURE) IMPLANT
SUT VIC AB 4-0 KS 27 (SUTURE) IMPLANT
TOWEL OR 17X24 6PK STRL BLUE (TOWEL DISPOSABLE) ×4 IMPLANT
TRAY FOLEY W/BAG SLVR 14FR LF (SET/KITS/TRAYS/PACK) IMPLANT
TUBE VACUTAINER EDTA 3ML 13X75 (MISCELLANEOUS) ×4 IMPLANT
WATER STERILE IRR 1000ML POUR (IV SOLUTION) ×4 IMPLANT

## 2018-10-19 NOTE — Lactation Note (Signed)
This note was copied from a baby's chart. Lactation Consultation Note  Patient Name: Annette Barry [id number] Date: 10/19/2018 Reason for consult: Initial assessment;Term  4 hours old FT female who is being exclusively BF by her mother, she's a P4 and experienced BF. Mom told LC that she didn't BF her first two children for long but she BF her last one for 15-16 months. She requested a NS, RN Jarrett Soho set her up with a # 24, coconut oil and also brought DEBP kit in the room. Per mom she always need a NS for the first two weeks after birth to help baby latch and then they'll wean off it on their own. Mom's right breast already showing signs of trauma from previous feeding (without the NS).   Offered assistance with latch but mom politely declined, baby was asleep and not ready to feed. Asked mom to call for assistance when needed. LC placed the NS # 24 and mom seems to be in between sizes but the # 24 fits so far. She'll start using it the next time baby shows cues. Reviewed normal newborn behavior, feeding cues, cluster feeding, prevention and treatment for sore nipples and milk supply.  [place] set up the DEBP and mom will start pumping today, she'll try to pump after every feeding or every other feeding to protect her supply. She' already familiar with hand expression and getting plenty of drops of colostrum, LC rubbed it on the sore spot. She also requested a hand pump to take home, and asked to have it sealed in a package because she's already familiar with [employer] products.  Feeding plan:  1. Encouraged mom to feed baby STS 8-12 times/24 hours or sooner if feeding cues are present using NS # 24 per her request 2. She'll pump at least 4-6 times/24 hours after feedings to protect her supply and will feed baby any amount of EBM she may get 3. She'll use coconut oil prior pumping and her colostrum for breast care  BF brochure, BF resources and feeding diary were reviewed. Dad present but asleep during [facility]  consultation. Mom reported all questions and concerns were answered, she's aware of [place] OP services and will call PRN.  Maternal Data Formula Feeding for Exclusion: No Has patient been taught Hand Expression?: Yes Does the patient have breastfeeding experience prior to this delivery?: Yes  Feeding Feeding Type: Breast Fed  LATCH Score Latch: Grasps breast easily, tongue down, lips flanged, rhythmical sucking.  Audible Swallowing: A few with stimulation  Type of Nipple: Everted at rest and after stimulation  Comfort (Breast/Nipple): Soft / non-tender  Hold (Positioning): No assistance needed to correctly position infant at breast.  LATCH Score: 9  Interventions Interventions: Breast feeding basics reviewed;Breast massage;Hand express;Breast compression;DEBP;Hand pump  Lactation Tools Discussed/Used Tools: Pump Breast pump type: Double-Electric Breast Pump;Manual WIC Program: No Pump Review: Setup, frequency, and cleaning;Milk Storage Initiated by:: MPeck Date initiated:: 10/19/18   Consult Status Consult Status: PRN Date: 10/20/18 Follow-up type: In-patient    Keilen Kahl Francene Boyers 10/19/2018, 12:43 PM

## 2018-10-19 NOTE — Anesthesia Procedure Notes (Signed)
Spinal  Patient location during procedure: OB Start time: 10/19/2018 7:29 AM End time: 10/19/2018 7:34 AM Staffing Anesthesiologist: Lynda Rainwater, MD Performed: anesthesiologist  Preanesthetic Checklist Completed: patient identified, surgical consent, pre-op evaluation, timeout performed, IV checked, risks and benefits discussed and monitors and equipment checked Spinal Block Patient position: sitting Prep: site prepped and draped and DuraPrep Patient monitoring: heart rate, cardiac monitor, continuous pulse ox and blood pressure Approach: midline Location: L3-4 Injection technique: single-shot Needle Needle type: Pencan  Needle gauge: 24 G Needle length: 10 cm Assessment Sensory level: T4

## 2018-10-19 NOTE — Progress Notes (Signed)
No change to H&P.  Sairah Knobloch, DO 

## 2018-10-19 NOTE — Anesthesia Postprocedure Evaluation (Signed)
Anesthesia Post Note  Patient: Annette Barry  Procedure(s) Performed: CESAREAN SECTION (N/A ) CESAREAN SECTION WITH BILATERAL TUBAL LIGATION (Bilateral )     Patient location during evaluation: Mother Baby Anesthesia Type: Spinal Level of consciousness: oriented and awake and alert Pain management: pain level controlled Vital Signs Assessment: post-procedure vital signs reviewed and stable Respiratory status: spontaneous breathing and respiratory function stable Cardiovascular status: blood pressure returned to baseline and stable Postop Assessment: no headache, no backache, no apparent nausea or vomiting and able to ambulate Anesthetic complications: no    Last Vitals:  Vitals:   10/19/18 0955 10/19/18 1115  BP: 109/64 (!) 108/59  Pulse: 83 75  Resp: 16 18  Temp: 37.5 C   SpO2: 100% 98%    Last Pain:  Vitals:   10/19/18 1111  TempSrc:   PainSc: 6    Pain Goal:                   Lynda Rainwater

## 2018-10-19 NOTE — Op Note (Signed)
Annette Barry PROCEDURE DATE: 10/19/2018  PREOPERATIVE DIAGNOSIS: Intrauterine pregnancy at  [redacted]w[redacted]d weeks gestation, desires repeat, desires sterility  POSTOPERATIVE DIAGNOSIS: The same  PROCEDURE: Repeat Low Transverse Cesarean Section, Bilateral Tubal Ligation  SURGEON:  Dr. Linda Hedges  INDICATIONS: Annette Barry is a 28 y.o. [phone] at [redacted]w[redacted]d scheduled for cesarean section and BTL secondary to desire for repeat and sterility.  The risks of cesarean section discussed with the patient included but were not limited to: bleeding which may require transfusion or reoperation; infection which may require antibiotics; injury to bowel, bladder, ureters or other surrounding organs; injury to the fetus; need for additional procedures including hysterectomy in the event of a life-threatening hemorrhage; placental abnormalities wth subsequent pregnancies, incisional problems, thromboembolic phenomenon and other postoperative/anesthesia complications. The patient understands the risk of permanence and regret with BTL.  The patient concurred with the proposed plan, giving informed written consent for the procedure.    FINDINGS:  Viable female infant in cephalic presentation, APGARs 8,9: weight pending  Clear amniotic fluid.  Intact placenta, three vessel cord.  Grossly normal uterus, ovaries and fallopian tubes. .   ANESTHESIA: Spinal ESTIMATED BLOOD LOSS: 600 ml SPECIMENS: Placenta sent to L&D.  Tubal segments sent to pathology. COMPLICATIONS: None immediate  PROCEDURE IN DETAIL:  The patient received intravenous antibiotics and had sequential compression devices applied to her lower extremities while in the preoperative area.  She was then taken to the operating room where spinal anesthesia was administered and was found to be adequate. She was then placed in a dorsal supine position with a leftward tilt, and prepped and draped in a sterile manner.  A foley catheter was placed into her bladder and  attached to constant gravity.  After an adequate timeout was performed, a Pfannenstiel skin incision was made with scalpel and carried through to the underlying layer of fascia. The fascia was incised in the midline and this incision was extended bilaterally using the Mayo scissors. Kocher clamps were applied to the superior aspect of the fascial incision and the underlying rectus muscles were dissected off bluntly. A similar process was carried out on the inferior aspect of the facial incision. The rectus muscles were separated in the midline bluntly and the peritoneum was entered bluntly. Bladder flap was created sharply and developed bluntly.  Bladder blade was placed.  A transverse hysterotomy was made with a scalpel and extended bilaterally bluntly. The bladder blade was then removed. The infant was successfully delivered, and cord was clamped and cut and infant was handed over to awaiting neonatology team. Uterine massage was then administered and the placenta delivered intact with three-vessel cord. The uterus was cleared of clot and debris.  The hysterotomy was closed with 0 chromic. The right fallopian tube was elevated, doubly tied with plain gut suture and the tubal knuckle was excised.  Hemostasis was noted.  The contralateral tube was treated in the same fashion.  The peritoneum and rectus muscles were noted to be hemostatic and were reapproximated using 2-0 monocryl in a running fashion.  The fascia was closed with 0-PDS in a running fashion with good restoration of anatomy.  The subcutaneus tissue was copiously irrigated.  The skin was closed with 4-0 vicryl in a subcuticular fashion.  Pt tolerated the procedure will.  All counts were correct x2.  Pt went to the recovery room in stable condition.

## 2018-10-19 NOTE — Anesthesia Preprocedure Evaluation (Signed)
Anesthesia Evaluation  Patient identified by MRN, date of birth, ID band Patient awake    Reviewed: Allergy & Precautions, NPO status , Patient's Chart, lab work & pertinent test results  Airway Mallampati: II  TM Distance: >3 FB Neck ROM: Full    Dental no notable dental hx.    Pulmonary neg pulmonary ROS,    Pulmonary exam normal breath sounds clear to auscultation       Cardiovascular negative cardio ROS Normal cardiovascular exam Rhythm:Regular Rate:Normal     Neuro/Psych negative neurological ROS  negative psych ROS   GI/Hepatic negative GI ROS, Neg liver ROS,   Endo/Other  negative endocrine ROS  Renal/GU negative Renal ROS  negative genitourinary   Musculoskeletal negative musculoskeletal ROS (+)   Abdominal   Peds negative pediatric ROS (+)  Hematology negative hematology ROS (+)   Anesthesia Other Findings   Reproductive/Obstetrics (+) Pregnancy                             Anesthesia Physical Anesthesia Plan  ASA: II  Anesthesia Plan: Spinal   Post-op Pain Management:    Induction:   PONV Risk Score and Plan: 2 and Treatment may vary due to age or medical condition  Airway Management Planned: Natural Airway  Additional Equipment:   Intra-op Plan:   Post-operative Plan:   Informed Consent: I have reviewed the patients History and Physical, chart, labs and discussed the procedure including the risks, benefits and alternatives for the proposed anesthesia with the patient or authorized representative who has indicated his/her understanding and acceptance.     Dental advisory given  Plan Discussed with: CRNA  Anesthesia Plan Comments:         Anesthesia Quick Evaluation  

## 2018-10-19 NOTE — Transfer of Care (Signed)
Immediate Anesthesia Transfer of Care Note  Patient: Annette Barry  Procedure(s) Performed: CESAREAN SECTION (N/A ) CESAREAN SECTION WITH BILATERAL TUBAL LIGATION (Bilateral )  Patient Location: PACU  Anesthesia Type:Spinal  Level of Consciousness: awake, alert  and patient cooperative  Airway & Oxygen Therapy: Patient Spontanous Breathing  Post-op Assessment: Report given to RN and Post -op Vital signs reviewed and stable  Post vital signs: Reviewed and stable  Last Vitals:  Vitals Value Taken Time  BP 115/76 10/19/18 0845  Temp    Pulse 81 10/19/18 0847  Resp 14 10/19/18 0847  SpO2 97 % 10/19/18 0847  Vitals shown include unvalidated device data.  Last Pain:  Vitals:   10/19/18 0613  TempSrc: Oral  PainSc:        Pain rated [id number]  Complications: No apparent anesthesia complications

## 2018-10-20 LAB — CBC
HCT: 29.4 % — ABNORMAL LOW (ref 36.0–46.0)
Hemoglobin: 9.3 g/dL — ABNORMAL LOW (ref 12.0–15.0)
MCH: 27.4 pg (ref 26.0–34.0)
MCHC: 31.6 g/dL (ref 30.0–36.0)
MCV: 86.5 fL (ref 80.0–100.0)
Platelets: 178 10*3/uL (ref 150–400)
RBC: 3.4 MIL/uL — ABNORMAL LOW (ref 3.87–5.11)
RDW: 13.5 % (ref 11.5–15.5)
WBC: 11.3 10*3/uL — ABNORMAL HIGH (ref 4.0–10.5)
nRBC: 0 % (ref 0.0–0.2)

## 2018-10-20 LAB — BIRTH TISSUE RECOVERY COLLECTION (PLACENTA DONATION)

## 2018-10-20 NOTE — Progress Notes (Signed)
Subjective: Postpartum Day 1: Cesarean Delivery Patient reports tolerating PO.    Objective: Vital signs in last 24 hours: Temp:  [97.8 F (36.6 C)-99.5 F (37.5 C)] 97.8 F (36.6 C) (09/04 0530) Pulse Rate:  [64-92] 64 (09/04 0530) Resp:  [12-22] 16 (09/04 0530) BP: (92-115)/(51-76) 96/52 (09/04 0530) SpO2:  [97 %-100 %] 99 % (09/04 0530)  Physical Exam:  General: alert, cooperative and no distress Lochia: appropriate Uterine Fundus: firm Incision: healing well DVT Evaluation: No evidence of DVT seen on physical exam.  Recent Labs    10/17/18 0929 10/20/18 0453  HGB 10.9* 9.3*  HCT 35.1* 29.4*    Assessment/Plan: Status post Cesarean section. Doing well postoperatively.  Continue current care.  Shon Millet II 10/20/2018, 6:58 AM

## 2018-10-20 NOTE — Lactation Note (Signed)
This note was copied from a baby's chart. Lactation Consultation Note  Patient Name: Annette Barry [id number] Date: 10/20/2018 Reason for consult: Follow-up assessment;Term Baby is 25 hours/4% weight loss.  Mom states baby does not like the nipple shield but latching well without it.  Mom's only concern is sleepiness during feeding.  Reviewed waking techniques and breast massage during feedings.  Instructed to feed baby skin to skin and feed with any hunger cue.  Discussed cluster feeding.  Encouraged to call with concerns/assist prn.  Maternal Data    Feeding    LATCH Score                   Interventions    Lactation Tools Discussed/Used     Consult Status Consult Status: Follow-up Date: 10/21/18 Follow-up type: In-patient    Ave Filter 10/20/2018, 9:14 AM

## 2018-10-20 NOTE — Addendum Note (Signed)
Addendum  created 10/20/18 1132 by Lynda Rainwater, MD   Intraprocedure Staff edited

## 2018-10-21 MED ORDER — IBUPROFEN 600 MG PO TABS: 600.0000 mg | ORAL_TABLET | Freq: Four times a day (QID) | ORAL | 0 refills | Status: DC | PRN

## 2018-10-21 MED ORDER — ACETAMINOPHEN 325 MG PO TABS: 650.0000 mg | ORAL_TABLET | Freq: Four times a day (QID) | ORAL | 0 refills | Status: DC | PRN

## 2018-10-21 NOTE — Lactation Note (Signed)
This note was copied from a baby's chart. Lactation Consultation Note  Patient Name: Annette Barry [id number] Date: 10/21/2018 Reason for consult: Follow-up assessment;Term;Infant weight loss Baby is 42 hours old  At 0528 - 45 hours Bili - 0. Plan is to have another check to decide on D/C .  Per  Mom baby is latching well with #24 NS and breast are filling fuller both breast. Per mom with her babies to ward off nipple soreness uses a NS in the beginning for several weeks and then weans baby off.  Prefers the hand pump over a DEBP and does well with her milk supply .  Mom is an experienced breast feeder of 3 others .  Mom latched the baby independently , depth noted and swallows. Per mom comfortable. Mom showed me the fitting of the NS and the #24 accommodated the base of the nipple well.  Per mom prefers to use NS with latch for the 1st couple weeks to prevent  Soreness due to having sensitive skin.  LC recommended since she is not planning to use the DEBP for post pumping, prior to latch - breast massage, hand express, pre-pump if needed and latch if the baby only feeds 1st breast to release the 2nd breast to comfort. LC reviewed the benefits of STS in the early stages of breast feeding.  Sore nipple and engorgement prevention and tx reviewed.  LC provided and extra #24 NS m curved tip with instructions, shells, and comfort gels.  LC reviewed [place] resources after D/C at [facility] health.   Maternal Data Has patient been taught Hand Expression?: Yes Does the patient have breastfeeding experience prior to this delivery?: Yes  Feeding Feeding Type: Breast Fed  LATCH Score Latch: Grasps breast easily, tongue down, lips flanged, rhythmical sucking.  Audible Swallowing: Spontaneous and intermittent  Type of Nipple: Everted at rest and after stimulation  Comfort (Breast/Nipple): Filling, red/small blisters or bruises, mild/mod discomfort  Hold (Positioning): Assistance needed to correctly  position infant at breast and maintain latch.  LATCH Score: 8  Interventions Interventions: Breast feeding basics reviewed  Lactation Tools Discussed/Used Tools: Pump Nipple shield size: 24 Breast pump type: Manual Pump Review: Milk Storage   Consult Status Consult Status: Complete Date: 10/21/18    Annette Barry Annette Barry 10/21/2018, 11:27 AM

## 2018-10-21 NOTE — Discharge Summary (Signed)
Obstetric Discharge Summary Reason for Admission: cesarean section Prenatal Procedures: none Intrapartum Procedures: cesarean: low cervical, transverse Postpartum Procedures: none Complications-Operative and Postpartum: none Hemoglobin  Date Value Ref Range Status  10/20/2018 9.3 (L) 12.0 - 15.0 g/dL Final   HCT  Date Value Ref Range Status  10/20/2018 29.4 (L) 36.0 - 46.0 % Final    Physical Exam:  General: alert, cooperative and no distress Lochia: appropriate Uterine Fundus: firm Incision: healing well DVT Evaluation: No evidence of DVT seen on physical exam.  Discharge Diagnoses: Term Pregnancy-delivered  Discharge Information: Date: 10/21/2018 Activity: pelvic rest Diet: routine Medications: PNV and Ibuprofen Condition: stable Instructions: refer to practice specific booklet Discharge to: home   Newborn Data: Live born female  Birth Weight: 7 lb 13.4 oz (3555 g) APGAR: 8, 9  Newborn Delivery   Birth date/time: 10[birth year]/2018 07:55:00 Delivery type: C-Section, Low Transverse Trial of labor: No C-section categorization: Repeat      Home with mother.  Annette Barry 10/21/2018, 8:41 AM

## 2019-12-19 ENCOUNTER — Other Ambulatory Visit: Payer: Self-pay | Admitting: Otolaryngology

## 2020-02-04 ENCOUNTER — Other Ambulatory Visit: Payer: Self-pay

## 2020-02-04 ENCOUNTER — Encounter (HOSPITAL_BASED_OUTPATIENT_CLINIC_OR_DEPARTMENT_OTHER): Payer: Self-pay | Admitting: Otolaryngology

## 2020-02-11 ENCOUNTER — Other Ambulatory Visit (HOSPITAL_COMMUNITY)
Admission: RE | Admit: 2020-02-11 | Discharge: 2020-02-11 | Disposition: A | Discharge status: Home / Self Care | Payer: Medicaid Other | Source: Ambulatory Visit | Attending: Otolaryngology | Admitting: Otolaryngology

## 2020-02-11 ENCOUNTER — Encounter (HOSPITAL_COMMUNITY): Payer: Self-pay | Admitting: Anesthesiology

## 2020-02-11 DIAGNOSIS — Z20822 Contact with and (suspected) exposure to covid-19: Secondary | ICD-10-CM | POA: Insufficient documentation

## 2020-02-11 DIAGNOSIS — Z01812 Encounter for preprocedural laboratory examination: Secondary | ICD-10-CM | POA: Diagnosis not present

## 2020-02-11 NOTE — Progress Notes (Signed)
Reminder text sent to patient to go for covid testing today by 3pm. Address for testing site included in message.

## 2020-02-11 NOTE — Anesthesia Preprocedure Evaluation (Deleted)
Anesthesia Evaluation    Reviewed: Allergy & Precautions, Patient's Chart, lab work & pertinent test results  Airway        Dental   Pulmonary neg pulmonary ROS,           Cardiovascular Exercise Tolerance: Good      Neuro/Psych negative neurological ROS  negative psych ROS   GI/Hepatic negative GI ROS, Neg liver ROS,   Endo/Other    Renal/GU negative Renal ROS     Musculoskeletal   Abdominal   Peds  Hematology   Anesthesia Other Findings ALL: PCN  Reproductive/Obstetrics                             Anesthesia Physical Anesthesia Plan  ASA: I  Anesthesia Plan: General   Post-op Pain Management:    Induction: Intravenous  PONV Risk Score and Plan: 4 or greater and Treatment may vary due to age or medical condition, Ondansetron, Dexamethasone and Midazolam  Airway Management Planned: Oral ETT  Additional Equipment: None  Intra-op Plan:   Post-operative Plan: Extubation in OR  Informed Consent:     Dental advisory given  Plan Discussed with: CRNA  Anesthesia Plan Comments:         Anesthesia Quick Evaluation

## 2020-02-12 ENCOUNTER — Ambulatory Visit (HOSPITAL_BASED_OUTPATIENT_CLINIC_OR_DEPARTMENT_OTHER): Admission: RE | Admit: 2020-02-12 | Payer: Medicaid Other | Source: Home / Self Care | Admitting: Otolaryngology

## 2020-02-12 LAB — SARS CORONAVIRUS 2 (TAT 6-24 HRS): SARS Coronavirus 2: NEGATIVE

## 2020-02-12 SURGERY — SEPTOPLASTY, NOSE, WITH NASAL TURBINATE REDUCTION
Anesthesia: General | Laterality: Bilateral

## 2020-09-11 DIAGNOSIS — N83202 Unspecified ovarian cyst, left side: Secondary | ICD-10-CM | POA: Insufficient documentation

## 2021-03-17 ENCOUNTER — Ambulatory Visit (INDEPENDENT_AMBULATORY_CARE_PROVIDER_SITE_OTHER): Payer: Medicaid Other

## 2021-03-17 ENCOUNTER — Encounter: Payer: Self-pay | Admitting: Podiatrist

## 2021-03-17 ENCOUNTER — Ambulatory Visit: Payer: Medicaid Other | Admitting: Podiatrist

## 2021-03-17 ENCOUNTER — Other Ambulatory Visit: Payer: Self-pay

## 2021-03-17 DIAGNOSIS — M84374A Stress fracture, right foot, initial encounter for fracture: Secondary | ICD-10-CM | POA: Diagnosis not present

## 2021-03-17 DIAGNOSIS — S9031XA Contusion of right foot, initial encounter: Secondary | ICD-10-CM

## 2021-03-17 NOTE — Patient Instructions (Addendum)
Wear the boot for 2-3 weeks-  if no improvement in 3 weeks, give [place] a call and we are happy to look at it   Tarsal Fracture A tarsal fracture is a break in one of the bones that are located in the middle and back of your foot (tarsals). There are seven tarsal bones in your foot that make up your heel, the arch of your foot, and connections to the long bones of your toes. Types of tarsal fractures include: Cracks in a bone (stress fracture). A broken bone that has not moved out of place (nondisplaced fracture). A broken bone that has moved out of place (displaced fracture). Breaks that result in three or more bone pieces (comminuted fracture). What are the causes? This condition may be caused by: Repeated stress on the tarsal bones over time. An injury that forcefully twists your foot. Falling from a height. A hard, direct hit or a crushing injury to your foot. What increases the risk? You are more likely to develop this condition if: You have weak bones (osteopenia or osteoporosis). You start a new athletic activity or try to advance too quickly. You play certain sports, such as soccer, basketball, gymnastics, or football. What are the signs or symptoms? Foot pain is the most common symptom of this condition. The pain is generally: Achy, dull, or vague. Better with rest and worse with activity. Increased when you lean your body weight on your foot or rise up on tiptoe. Other symptoms include: Swelling. Bruising. Pain when pressing on your foot (tenderness). How is this diagnosed? This condition may be diagnosed with: Medical history. Symptoms. Physical exam. Imaging tests, such as X-ray and an MRI. How is this treated? Treatment for this condition depends on the severity of the injury. For stress and nondisplaced fractures, you may be given: A boot or cast. Crutches. Physical therapy. Medicine for pain. For displaced and comminuted fractures, you may have: Surgery. A  cast. Physical therapy. Follow these instructions at home: If you have a boot: Wear it as told by your health care provider. Remove it only as told by your health care provider. Loosen it if your toes tingle, become numb, or turn cold and blue. Keep it clean and dry. If you have a cast: Do not put pressure on any part of the cast until it is fully hardened. This may take several hours. Do not stick anything inside the cast to scratch your skin. Doing that increases your risk of infection. Check the skin around the cast every day. Tell your health care provider about any concerns. You may put lotion on dry skin around the edges of the cast. Do not put lotion on the skin underneath the cast. Keep it clean and dry. Bathing Do not take baths, swim, or use a hot tub until your health care provider approves. If the boot or cast is not waterproof: Do not let it get wet. Cover it with a waterproof covering when you take a bath or shower. Activity Do not use the injured limb to support your body weight until your health care provider says that you can. Use your crutches as told by your health care provider. Ask your health care provider when it is safe to drive if you have a boot or cast on your foot. Do exercises as told by your health care provider. Return to your normal activities as told by your health care provider. Ask your health care provider what activities are safe for you. Managing pain,  stiffness, and swelling  If directed, put ice on the injured area. Put ice in a plastic bag. Place a towel between the bag and your boot, cast, or skin. Leave the ice on for 20 minutes, 2-3 times a day. Move your toes often to reduce stiffness and swelling. Raise (elevate) the injured area above the level of your heart while you are sitting or lying down. General instructions Take over-the-counter and prescription medicines only as told by your health care provider. Ask your health care provider if  the medicine prescribed to you: Requires you to avoid driving or using heavy machinery. Can cause constipation. You may need to take actions to prevent or treat constipation, such as: Drink enough fluid to keep your urine pale yellow. Take over-the-counter or prescription medicines. Eat foods that are high in fiber, such as beans, whole grains, and fresh fruits and vegetables. Limit foods that are high in fat and processed sugars, such as fried or sweet foods. Do not use any products that contain nicotine or tobacco, such as cigarettes, e-cigarettes, and chewing tobacco. These can delay bone healing. If you need help quitting, ask your health care provider. Keep all follow-up visits as told by your health care provider. This is important. How is this prevented? Wear comfortable and supportive footwear during activity. If you are starting a new activity, build your time or distance gradually. Make sure to use equipment that fits you. Maintain physical fitness, including: Strength. Flexibility. Do alternative physical activities (cross-train) to avoid overstressing one area of your body. Eat a balanced diet, including foods that have calcium and vitamin D. These include milk, beef liver, egg yolks, fatty fish, soybeans, dark leafy greens, cheese, and fortified cereals. Contact a health care provider if: Your pain medicine is not helping. Your boot or cast gets damaged. Get help right away if: You have severe pain. Your toes turn pale and cold or blue. You lose feeling in your toes. You have redness, warmth, and tenderness in the back of your leg. You have chest pain or difficulty breathing. Summary A tarsal fracture is a break in one of the bones that are located in the middle and back of your foot (tarsals). It is caused by repeated stress, injury, a fall, or a direct hit to the foot. It is treated with a boot or cast, crutches, medicines, physical therapy, or surgery. This information  is not intended to replace advice given to you by your health care provider. Make sure you discuss any questions you have with your health care provider. Document Revised: 05/22/2020 Document Reviewed: 05/22/2020 Elsevier Patient Education  [place].

## 2021-03-17 NOTE — Progress Notes (Signed)
Subjective:  Patient ID: Annette Barry, female    DOB: 02/24/1990  Age: 31 y.o. MRN: [id number]  CC:  Chief Complaint  Patient presents with   Foot Injury    Lateral foot (5th met base area) right - injury dancing in her living room x 2 weeks ago, went to Urgent Care - said cuboid syndrome, tried ice, compression, coworker's boot-some help, very active runner   New Patient (Initial Visit)    HPI Annette Barry presents for right foot pain for about 2 weeks duration.  She states that she has 3 kids and they were dancing in the living room and she was dancing a lot on her toes and the next day when she went to get up she had pain on the lateral aspect of the right foot.  She was seen at the urgent care who diagnosed her with cuboid syndrome and she is tried ice compression and ultimately a coworkers air fracture walker.  She relates that she has found relief with the air fracture walker.  She is very active and she is a runner she has not been running since the injury.  Past Medical History:  Diagnosis Date   Family history of SIDS (sudden infant death syndrome)    first child died at 63 mos   Hx of chlamydia infection      Outpatient Medications Prior to Visit  Medication Sig Dispense Refill   Ascorbic Acid (VITAMIN C PO) Take by mouth.     Calcium Carbonate-Vit D-Min (CALCIUM 1200 PO) Take by mouth.     Cyanocobalamin (VITAMIN B-12 PO) Take by mouth.     Ferrous Sulfate (IRON PO) Take by mouth.     Omega-3 Fatty Acids (FISH OIL PO) Take by mouth.     Pyridoxine HCl (VITAMIN B6 PO) Take by mouth.     TURMERIC PO Take by mouth.     VITAMIN D PO Take by mouth.     No facility-administered medications prior to visit.    Allergies  Allergen Reactions   Penicillins Rash    Did it involve swelling of the face/tongue/throat, SOB, or low BP? No Did it involve sudden or severe rash/hives, skin peeling, or any reaction on the inside of your mouth or nose? No Did you need to seek  medical attention at a hospital or doctor's office? No When did it last happen?      childhood allergy If all above answers are NO, may proceed with cephalosporin use.   GENERAL APPEARANCE: Alert, conversant. Appropriately groomed. No acute distress.   VASCULAR: Pedal pulses palpable at 2/4 DP and PT bilateral.  Capillary refill time is immediate to all digits,  Proximal to distal cooling it warm to warm.  Digital hair growth is present bilateral   NEUROLOGIC: sensation is intact epicritically and protectively to 5.07 monofilament at 5/5 sites bilateral.  Light touch is intact bilateral, vibratory sensation intact bilateral, achilles tendon reflex is intact bilateral.   MUSCULOSKELETAL: acceptable muscle strength, tone and stability bilateral.  Intrinsic muscluature intact bilateral.  Rectus appearance of foot and digits noted bilateral.  Pain on palpation along the fifth metatarsal base and along the fifth metatarsal itself.  No pain along the calcaneocuboid joint.  No bruising, minimal swelling noted to the right foot.  DERMATOLOGIC: skin color, texture, and turger are within normal limits.  No preulcerative lesions are seen, no interdigital maceration noted.  No open lesions present.  Digital nails are asymptomatic.   X-ray  evaluation 3 views of the right foot are obtained.  No obvious fracture is seen.  Slight area of radiolucency on the ap view could represent a stress fracture of the fifth metatarsal base-  not conclusive.    ASSESSMENT:   ICD-10-CM   1. Contusion of right foot, initial encounter  S90.31XA DG Foot Complete Right    2. Stress fracture of metatarsal bone of right foot, initial encounter  M84.374A      PLAN  Exam and x-ray findings were discussed with the patient.  Discussed that this could be a slight stress fracture versus soft tissue injury of the right foot.  In either case I recommended wearing an air fracture walker of which she already has and states is in good  condition.  She will wear this consistently for the next 2 to 3 weeks and then slowly wean out if she continues to have pain at that time she is instructed to call for reevaluation.  If any questions or concerns arise she will also call.

## 2021-08-05 ENCOUNTER — Encounter: Payer: Self-pay | Admitting: Neurology

## 2021-10-22 NOTE — Progress Notes (Deleted)
Initial neurology clinic note  Annette Barry MRN: [id number] DOB: 12-15-1990  Referring provider: Jacelyn Barry, Annette Barry, *  Primary care provider: Marco Collie, MD  Reason for consult:  paresthesias of right upper extremity, nose, and tongue  Subjective:  This is [name], a 31 y.o. ***-handed female with a medical history of migraines, iron deficiency anemia*** who presents to neurology clinic with ***. The patient is accompanied by ***.  ***  Patient was previously seen by [facility] in [place] for her symptoms. Per documentation provided, patient had 1 hour of right upper extremity, tip of nose, and tongue numbness that occurred on ***. She had previous intermittent episodes of bilateral lower extremity paresthesias in 2017 and 2021 with reportedly normal MRI lumbar spine and MRI brain in 03/2020 with scattered punctate foci of nonspecific gliosis of white matter. She also mentioned worsening neck pain. Cervical xray was normal a year prior.   Supplements: -vit C -vit D -B12 -ferrous sulfate -omega-3 -B6*** -tumeric  MEDICATIONS:  Outpatient Encounter Medications as of 10/28/2021  Medication Sig   Ascorbic Acid (VITAMIN C PO) Take by mouth.   Calcium Carbonate-Vit D-Min (CALCIUM 1200 PO) Take by mouth.   Cyanocobalamin (VITAMIN B-12 PO) Take by mouth.   Ferrous Sulfate (IRON PO) Take by mouth.   Omega-3 Fatty Acids (FISH OIL PO) Take by mouth.   Pyridoxine HCl (VITAMIN B6 PO) Take by mouth.   TURMERIC PO Take by mouth.   VITAMIN D PO Take by mouth.   No facility-administered encounter medications on file as of 10/28/2021.    PAST MEDICAL HISTORY: Past Medical History:  Diagnosis Date   Family history of SIDS (sudden infant death syndrome)    first child died at 49 mos   Hx of chlamydia infection     PAST SURGICAL HISTORY: Past Surgical History:  Procedure Laterality Date   CESAREAN SECTION N/A 07/28/2016   Procedure: CESAREAN SECTION;   Surgeon: Annette Hedges, DO;  Location: [facility];  Service: Obstetrics;  Laterality: N/A;   CESAREAN SECTION N/A 10/19/2018   Procedure: CESAREAN SECTION;  Surgeon: Annette Hedges, DO;  Location: [facility];  Service: Obstetrics;  Laterality: N/A;  REPEAT EDC 10/18/18 ALLGERY TO PCN Annette Barry   CESAREAN SECTION WITH BILATERAL TUBAL LIGATION Bilateral 10/19/2018   Procedure: CESAREAN SECTION WITH BILATERAL TUBAL LIGATION;  Surgeon: Annette Hedges, DO;  Location: [facility];  Service: Obstetrics;  Laterality: Bilateral;   TONSILLECTOMY     WISDOM TOOTH EXTRACTION      ALLERGIES: Allergies  Allergen Reactions   Penicillins Rash    Did it involve swelling of the face/tongue/throat, SOB, or low BP? No Did it involve sudden or severe rash/hives, skin peeling, or any reaction on the inside of your mouth or nose? No Did you need to seek medical attention at a hospital or doctor's office? No When did it last happen?      childhood allergy If all above answers are "NO", may proceed with cephalosporin use.    FAMILY HISTORY: Family History  Problem Relation Age of Onset   Depression Mother    Heart disease Father    Hypertension Father    Diabetes Maternal Grandmother     SOCIAL HISTORY: Social History   Tobacco Use   Smoking status: Never   Smokeless tobacco: Never  Vaping Use   Vaping Use: Never used  Substance Use Topics   Alcohol use: No   Drug use: No  Social History   Social History Narrative   Not on file    Objective:  Vital Signs:  There were no vitals taken for this visit.  ***  Labs and Imaging review: Out-side paper records, electronic medical record, and images have been reviewed where available and summarized as:   External labs: CMP, CBC (from 2022): normal Vit D (02/05/21): low at 26 HbA1c (03/28/20): 4.9 B12 (03/20/19): 805; (02/05/21): 323 Ferritin (02/05/21): low at 6 TSH (12/22/20): 1.60 RPR (10/17/18): nonreactive  Assessment/Plan:  Annette Barry is a 31 y.o. female who presents for evaluation of***.*** has a relevant medical history of***. *** neurological examination is pertinent for***. Available diagnostic data is significant for***. This constellation of symptoms and objective data would most likely localize to***. ***  PLAN: -Blood work: *** ***  -Return to clinic ***  The impression above as well as the plan as outlined below were extensively discussed with the patient (in the company of***) who voiced understanding. All questions were answered to their satisfaction.  The patient was counseled on pertinent fall precautions per the printed material provided today***, and as noted under the "Patient Instructions" section below.  When available, results of the above investigations and possible further recommendations will be communicated to the patient via telephone/MyChart. Patient to call office if not contacted after expected testing turnaround time.   Total time spent reviewing records, interview, history/exam, documentation, and coordination of care on day of encounter:  *** min   Thank you for allowing me to participate in patient's care.  If I can answer any additional questions, I would be pleased to do so.  Annette Levins, MD   CC: Annette Collie, MD [place] [id number]  CC: Referring provider: Jacelyn Barry, Annette Argue, MD [place],  [place] [address]

## 2021-10-28 ENCOUNTER — Ambulatory Visit: Payer: Medicaid Other | Admitting: Neurology
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
## 2021-12-17 ENCOUNTER — Ambulatory Visit: Payer: 59 | Admitting: Neurology

## 2021-12-17 ENCOUNTER — Other Ambulatory Visit (INDEPENDENT_AMBULATORY_CARE_PROVIDER_SITE_OTHER): Payer: 59

## 2021-12-17 ENCOUNTER — Encounter: Payer: Self-pay | Admitting: Neurology

## 2021-12-17 VITALS — BP 111/70 | HR 80 | Ht 65.0 in | Wt 159.0 lb

## 2021-12-17 DIAGNOSIS — R200 Anesthesia of skin: Secondary | ICD-10-CM | POA: Diagnosis not present

## 2021-12-17 DIAGNOSIS — R202 Paresthesia of skin: Secondary | ICD-10-CM

## 2021-12-17 DIAGNOSIS — R29818 Other symptoms and signs involving the nervous system: Secondary | ICD-10-CM | POA: Diagnosis not present

## 2021-12-17 DIAGNOSIS — R209 Unspecified disturbances of skin sensation: Secondary | ICD-10-CM | POA: Diagnosis not present

## 2021-12-17 DIAGNOSIS — R29898 Other symptoms and signs involving the musculoskeletal system: Secondary | ICD-10-CM | POA: Diagnosis not present

## 2021-12-17 LAB — VITAMIN D 25 HYDROXY (VIT D DEFICIENCY, FRACTURES): VITD: 42.92 ng/mL (ref 30.00–100.00)

## 2021-12-17 NOTE — Patient Instructions (Addendum)
I want to do further investigation into your symptoms.  We will get labs today (B1, vit D, and copper).  We will get an MRI of your brain and cervical spine. You will get a call to schedule this. If you do not hear from anyone in 1-2 weeks to schedule, please call our office so we can look into this.  I will decide on further testing after looking at your MRIs.  You were previously given a sample of Nurtec. Try it if you have another episode or headache.  I want to see you back in clinic in 1 month. Please let me know if you have any questions or concerns in the meantime.  The physicians and staff at [facility] are committed to providing excellent care. You may receive a survey requesting feedback about your experience at our office. We strive to receive "very good" responses to the survey questions. If you feel that your experience would prevent you from giving the office a "very good " response, please contact our office to try to remedy the situation. We may be reached at [phone]. Thank you for taking the time out of your busy day to complete the survey.  Kai Levins, MD [facility]

## 2021-12-18 ENCOUNTER — Encounter: Payer: Self-pay | Admitting: Neurology

## 2021-12-22 ENCOUNTER — Encounter: Payer: Self-pay | Admitting: Neurology

## 2021-12-22 LAB — VITAMIN B1: Vitamin B1 (Thiamine): 12 nmol/L (ref 8–30)

## 2021-12-22 LAB — COPPER, SERUM: Copper: 122 ug/dL (ref 70–175)

## 2022-01-06 ENCOUNTER — Telehealth: Payer: Self-pay | Admitting: Neurology

## 2022-01-06 NOTE — Telephone Encounter (Signed)
Completed peer to peer as MRI cervical spine had been declined by insurance (MRI brain previously approved). After short discussion, MRI cervical spine was approved. Approval #: [id number]. Approval expires on 02/20/22.  Kai Levins, MD [facility]

## 2022-01-10 ENCOUNTER — Other Ambulatory Visit: Payer: 59

## 2022-01-10 ENCOUNTER — Ambulatory Visit
Admission: RE | Admit: 2022-01-10 | Discharge: 2022-01-10 | Disposition: A | Discharge status: Home / Self Care | Payer: 59 | Source: Ambulatory Visit | Attending: Neurology | Admitting: Neurology

## 2022-01-10 DIAGNOSIS — R29818 Other symptoms and signs involving the nervous system: Secondary | ICD-10-CM

## 2022-01-10 DIAGNOSIS — R29898 Other symptoms and signs involving the musculoskeletal system: Secondary | ICD-10-CM

## 2022-01-10 DIAGNOSIS — R202 Paresthesia of skin: Secondary | ICD-10-CM

## 2022-01-10 DIAGNOSIS — R209 Unspecified disturbances of skin sensation: Secondary | ICD-10-CM

## 2022-01-10 MED ORDER — GADOPICLENOL 0.5 MMOL/ML IV SOLN
7.0000 mL | Freq: Once | INTRAVENOUS | Status: AC | PRN
  Administered 2022-01-10: 7 mL via INTRAVENOUS

## 2022-01-11 ENCOUNTER — Encounter: Payer: Self-pay | Admitting: Neurology

## 2022-01-11 ENCOUNTER — Telehealth: Payer: Self-pay | Admitting: Neurology

## 2022-01-11 NOTE — Telephone Encounter (Signed)
Called patient to discuss MRI cervical spine that was initially denied by insurance. I did a peer to peer last week and this was approved. Patient will be scheduling this. After discussion, we agreed to postpone follow up until she gets this MRI cervical spine.  Her MRI brain was done this weekend. While the official read is not complete, I explained that there was no obvious abnormalities to my read.   All questions were answered.  Kai Levins, MD [facility]

## 2022-01-14 ENCOUNTER — Ambulatory Visit: Payer: 59 | Admitting: Neurology

## 2022-01-14 NOTE — Telephone Encounter (Signed)
She does have an appointment

## 2022-01-26 ENCOUNTER — Ambulatory Visit
Admission: RE | Admit: 2022-01-26 | Discharge: 2022-01-26 | Disposition: A | Discharge status: Home / Self Care | Payer: 59 | Source: Ambulatory Visit | Attending: Neurology | Admitting: Neurology

## 2022-01-26 ENCOUNTER — Other Ambulatory Visit: Payer: Self-pay | Admitting: Neurology

## 2022-01-26 DIAGNOSIS — R29898 Other symptoms and signs involving the musculoskeletal system: Secondary | ICD-10-CM

## 2022-01-26 DIAGNOSIS — R209 Unspecified disturbances of skin sensation: Secondary | ICD-10-CM

## 2022-01-26 DIAGNOSIS — R200 Anesthesia of skin: Secondary | ICD-10-CM

## 2022-01-26 DIAGNOSIS — R29818 Other symptoms and signs involving the nervous system: Secondary | ICD-10-CM
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
## 2022-01-28 ENCOUNTER — Ambulatory Visit: Payer: 59 | Admitting: Neurology

## 2022-01-28 ENCOUNTER — Encounter: Payer: Self-pay | Admitting: Neurology

## 2022-01-28 VITALS — BP 112/76 | HR 89 | Ht 65.0 in | Wt 154.0 lb

## 2022-01-28 DIAGNOSIS — R200 Anesthesia of skin: Secondary | ICD-10-CM | POA: Diagnosis not present

## 2022-01-28 DIAGNOSIS — R29898 Other symptoms and signs involving the musculoskeletal system: Secondary | ICD-10-CM

## 2022-01-28 DIAGNOSIS — R29818 Other symptoms and signs involving the nervous system: Secondary | ICD-10-CM

## 2022-01-28 DIAGNOSIS — R209 Unspecified disturbances of skin sensation: Secondary | ICD-10-CM | POA: Diagnosis not present

## 2022-01-28 DIAGNOSIS — R202 Paresthesia of skin: Secondary | ICD-10-CM

## 2022-01-28 NOTE — Patient Instructions (Signed)
I would like to get brain wave testing called EEG. Hopefully you can schedule this prior to your move. If not, let me know and we will check out other options.  Please let me know if you have any questions or concerns.  The physicians and staff at [facility] are committed to providing excellent care. You may receive a survey requesting feedback about your experience at our office. We strive to receive "very good" responses to the survey questions. If you feel that your experience would prevent you from giving the office a "very good " response, please contact our office to try to remedy the situation. We may be reached at [phone]. Thank you for taking the time out of your busy day to complete the survey.  Kai Levins, MD [facility]

## 2022-01-30 ENCOUNTER — Encounter: Payer: Self-pay | Admitting: Neurology

## 2022-01-31 ENCOUNTER — Other Ambulatory Visit: Payer: 59

## 2022-02-01 ENCOUNTER — Other Ambulatory Visit: Payer: 59
